# Patient Record
Sex: Female | Born: 1935 | Race: White | Hispanic: No | Smoking: Never smoker
Health system: Southern US, Community
[De-identification: ages and names within clinical notes are randomized; demographics above are authoritative.]

## PROBLEM LIST (undated history)

## (undated) DIAGNOSIS — N183 Chronic kidney disease, stage 3 unspecified: Secondary | ICD-10-CM

## (undated) DIAGNOSIS — E785 Hyperlipidemia, unspecified: Secondary | ICD-10-CM

## (undated) DIAGNOSIS — S72002A Fracture of unspecified part of neck of left femur, initial encounter for closed fracture: Secondary | ICD-10-CM

## (undated) DIAGNOSIS — I639 Cerebral infarction, unspecified: Secondary | ICD-10-CM

## (undated) DIAGNOSIS — H269 Unspecified cataract: Secondary | ICD-10-CM

## (undated) DIAGNOSIS — I4891 Unspecified atrial fibrillation: Secondary | ICD-10-CM

## (undated) DIAGNOSIS — N179 Acute kidney failure, unspecified: Secondary | ICD-10-CM

## (undated) DIAGNOSIS — I10 Essential (primary) hypertension: Secondary | ICD-10-CM

---

## 1999-03-02 ENCOUNTER — Other Ambulatory Visit: Admission: RE | Admit: 1999-03-02 | Discharge: 1999-03-02 | Payer: Self-pay | Admitting: *Deleted

## 2000-09-03 ENCOUNTER — Other Ambulatory Visit: Admission: RE | Admit: 2000-09-03 | Discharge: 2000-09-03 | Payer: Self-pay | Admitting: *Deleted

## 2001-01-10 ENCOUNTER — Encounter: Admission: RE | Admit: 2001-01-10 | Discharge: 2001-01-10 | Payer: Self-pay | Admitting: *Deleted

## 2001-01-10 ENCOUNTER — Encounter: Payer: Self-pay | Admitting: *Deleted

## 2001-01-15 ENCOUNTER — Encounter: Payer: Self-pay | Admitting: *Deleted

## 2001-01-15 ENCOUNTER — Encounter: Admission: RE | Admit: 2001-01-15 | Discharge: 2001-01-15 | Payer: Self-pay | Admitting: *Deleted

## 2002-02-28 ENCOUNTER — Encounter: Payer: Self-pay | Admitting: *Deleted

## 2002-02-28 ENCOUNTER — Encounter: Admission: RE | Admit: 2002-02-28 | Discharge: 2002-02-28 | Payer: Self-pay | Admitting: *Deleted

## 2002-03-19 ENCOUNTER — Encounter: Payer: Self-pay | Admitting: *Deleted

## 2002-03-19 ENCOUNTER — Encounter: Admission: RE | Admit: 2002-03-19 | Discharge: 2002-03-19 | Payer: Self-pay | Admitting: *Deleted

## 2005-05-31 ENCOUNTER — Ambulatory Visit (HOSPITAL_COMMUNITY): Admission: RE | Admit: 2005-05-31 | Discharge: 2005-05-31 | Payer: Self-pay | Admitting: Family Medicine

## 2005-06-13 ENCOUNTER — Other Ambulatory Visit: Admission: RE | Admit: 2005-06-13 | Discharge: 2005-06-13 | Payer: Self-pay | Admitting: Obstetrics and Gynecology

## 2005-06-15 ENCOUNTER — Encounter: Admission: RE | Admit: 2005-06-15 | Discharge: 2005-06-15 | Payer: Self-pay | Admitting: Family Medicine

## 2006-11-15 ENCOUNTER — Ambulatory Visit (HOSPITAL_COMMUNITY): Admission: RE | Admit: 2006-11-15 | Discharge: 2006-11-15 | Payer: Self-pay | Admitting: Family Medicine

## 2008-08-13 ENCOUNTER — Ambulatory Visit (HOSPITAL_COMMUNITY): Admission: RE | Admit: 2008-08-13 | Discharge: 2008-08-14 | Payer: Self-pay | Admitting: Ophthalmology

## 2008-08-27 ENCOUNTER — Ambulatory Visit (HOSPITAL_COMMUNITY): Admission: RE | Admit: 2008-08-27 | Discharge: 2008-08-27 | Payer: Self-pay | Admitting: Ophthalmology

## 2011-02-07 NOTE — Op Note (Signed)
NAME:  Elizabeth Pierce, Elizabeth Pierce                 ACCOUNT NO.:  [id number]   MEDICAL RECORD NO.:  [id number]          PATIENT TYPE:  OIB   LOCATION:  5158                         FACILITY:  [facility]   PHYSICIAN:  Beulah Gandy. Ashley Royalty, M.D. 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   PROCEDURES:  1. Pars plana vitrectomy with 25-gauge system.  2. Phacofragmentation.  3. Retinal photocoagulation.  4. Gas fluid exchange.  5. Membrane peel.  6. Silicone oral injection in the right eye.   SURGEON:  Beulah Gandy. Ashley Royalty, MD   ASSISTANT:  Rosalie Doctor, MA   ANESTHESIA:  General.   DETAILS:  After proper endotracheal anesthesia, the usual prep and drape  was performed, 25 gauge trocar placed at 8 o'clock with no access to the  vitreous cavity.  Dark red blood came forth, 25 gauge trocar placed at 4  o'clock with no access to the vitreous cavity.  Dark red blood came  forth, 25 gauge trocar at 10 o'clock and 2 o'clock.  Good access to the  vitreous cavity occurred and the vitreous area was illuminated.  Blood  was seen in the vitreous cavity as well as lens material, both nuclear  and cortical material.  An additional trocar was placed at 12 o'clock  with infusion.  Provisc was placed on the corneal surface.  The pars  plana vitrectomy was begun just behind the pseudophacos.  The lens  material was removed carefully under low suction and rapid cutting.  The  vitreous infusion was raised to 40 mmHg because large inferior choroidal  detachments were found to be almost kissing.  The choroidals were  drained with 25-gauge trocars at 4 o'clock and 8 o'clock and there was  some resolution of the choroidal opening up the vitreous cavity.  As the  vitrectomy proceeded posteriorly, yellow nuclear material and a  large  nucleus was seen.  The phacofragmentation device was inserted through a  new 20 gauge sclerotomy at 2:30 o'clock.  All of lens material was  removed.  Additional vitreous was removed with the 25-gauge cutter.  There was vitreous adherent to the posterior pole and disk.  This was  peeled with the lighted pick and removed with the vitreous cutter.  Endolaser was performed with 993 burns placed around the retinal  periphery.  The power was 1500 milliwatts, 1000 microns each and 0.1  seconds each.  After the entire vitreous cavity was cleaned, a total gas  fluid exchange was carried out with the 25-gauge cutter.  The choroidal  detachments partially resolved and additional blood came forth from the  peripheral trocars.  Silicone oil was then injected to re-inflate the  globe and take the place of the intravitreal gas.  A total fill was  performed but not an excess fill.  The trocars were removed individually  and dark red blood was allowed to egress as the trocars were  slowly  pulled.  Supplemental silicone oil was added as needed.  Once all  trocars were removed, the closing pressure was 10 with a Barraquer  tonometer.  Two interrupted 9-0 nylon sutures were used to close the  sclerotomy site at 2 o'clock.  The conjunctiva was closed with a wet-  field cautery.  Polymyxin and gentamicin were irrigated into Tenon's  space.  Atropine solution was applied.  Marcaine was injected around the  globe for postop pain.  Decadron 10 mg was injected into the lower  subconjunctival space.  Polysporin ophthalmic ointment and patch and  shield were placed.  The patient was awakened and taken to recovery in  satisfactory condition.   COMPLICATIONS:  None.   DURATION:  Two hours.      Beulah Gandy. Ashley Royalty, M.D.  Electronically Signed     JDM/MEDQ  D:  08/13/2008  T:  08/14/2008  Job:  [id number]

## 2011-02-07 NOTE — Op Note (Signed)
NAME:  Elizabeth Pierce, Elizabeth Pierce                 ACCOUNT NO.:  [id number]   MEDICAL RECORD NO.:  [id number]          PATIENT TYPE:  AMB   LOCATION:  SDS                          FACILITY:  [facility]   PHYSICIAN:  John D. Ashley Royalty, M.D. DATE OF BIRTH:  1919/09/03   DATE OF PROCEDURE:  08/27/2008  DATE OF DISCHARGE:  08/27/2008                               OPERATIVE REPORT   ADMISSION DIAGNOSIS:  Hyphema and glaucoma in the right eye.   PROCEDURE:  Anterior chamber washout of hyphema.   SURGEON:  Beulah Gandy. Ashley Royalty, M.D.   ANESTHESIA:  Local MAC.   DETAILS:  Usual prep and drape.  A 25-gauge trocar was placed through  the cornea in a tangential fashion at 7 o'clock and 2 o'clock, infusion  at 7 o'clock.  Continuous slow infusion was used and the vitreous cutter  was used to engage the hyphema and remove it from the anterior chamber.  Continuous rinsing allowed silicone oil droplets to come through the  trocar and out of the eye.  After 15 minutes of continuous rinsing, the  anterior chamber was totally cleaned and the gas was placed in the  anterior chamber.  The trocars were removed and 10-0 nylon was used to  close the wounds at 2 o'clock and 7 o'clock.  Additional gas was  injected to obtain a closing pressure of 10 with a Barraquer tonometer.  Polymyxin and gentamicin were irrigated in the Tenon space.  Atropine  solution was applied.  Decadron 10 mg was injected into the lower  subconjunctival space.  Marcaine was injected around the globe.  Polysporin Ophthalmic ointment, a patch and shield were placed.  The  patient was awakened and taken to recovery in satisfactory condition.      Beulah Gandy. Ashley Royalty, M.D.  Electronically Signed     JDM/MEDQ  D:  08/27/2008  T:  08/28/2008  Job:  [id number]

## 2011-06-13 ENCOUNTER — Encounter (INDEPENDENT_AMBULATORY_CARE_PROVIDER_SITE_OTHER): Payer: Medicare Other | Admitting: Ophthalmology

## 2011-06-13 DIAGNOSIS — H3530 Unspecified macular degeneration: Secondary | ICD-10-CM

## 2011-06-13 DIAGNOSIS — H26499 Other secondary cataract, unspecified eye: Secondary | ICD-10-CM

## 2011-06-13 DIAGNOSIS — H43819 Vitreous degeneration, unspecified eye: Secondary | ICD-10-CM

## 2011-06-20 ENCOUNTER — Encounter (INDEPENDENT_AMBULATORY_CARE_PROVIDER_SITE_OTHER): Payer: Medicare Other | Admitting: Ophthalmology

## 2011-06-26 ENCOUNTER — Encounter (INDEPENDENT_AMBULATORY_CARE_PROVIDER_SITE_OTHER): Payer: Medicare Other | Admitting: Ophthalmology

## 2011-06-26 DIAGNOSIS — H2700 Aphakia, unspecified eye: Secondary | ICD-10-CM

## 2011-06-26 DIAGNOSIS — H3530 Unspecified macular degeneration: Secondary | ICD-10-CM

## 2011-06-28 LAB — BASIC METABOLIC PANEL
BUN: 18
CO2: 26
Calcium: 9.6
Creatinine, Ser: 1.09
Glucose, Bld: 122 — ABNORMAL HIGH
Potassium: 4.3

## 2011-06-28 LAB — GLUCOSE, CAPILLARY
Glucose-Capillary: 134 — ABNORMAL HIGH
Glucose-Capillary: 158 — ABNORMAL HIGH

## 2011-06-28 LAB — CBC
HCT: 38
MCV: 86.9
WBC: 8.9

## 2011-06-30 LAB — GLUCOSE, CAPILLARY
Glucose-Capillary: 118 mg/dL — ABNORMAL HIGH (ref 70–99)
Glucose-Capillary: 94 mg/dL (ref 70–99)

## 2011-06-30 LAB — BASIC METABOLIC PANEL
BUN: 30 mg/dL — ABNORMAL HIGH (ref 6–23)
GFR calc non Af Amer: 29 mL/min — ABNORMAL LOW (ref >60)
Glucose, Bld: 112 mg/dL — ABNORMAL HIGH (ref 70–99)

## 2011-06-30 LAB — CBC
MCHC: 32.6 g/dL (ref 30.0–36.0)
MCV: 87.5 fL (ref 78.0–100.0)
WBC: 7.4 10*3/uL (ref 4.0–10.5)

## 2012-01-03 ENCOUNTER — Encounter (INDEPENDENT_AMBULATORY_CARE_PROVIDER_SITE_OTHER): Payer: Medicare Other | Admitting: Ophthalmology

## 2012-01-29 ENCOUNTER — Encounter (INDEPENDENT_AMBULATORY_CARE_PROVIDER_SITE_OTHER): Payer: Medicare Other | Admitting: Ophthalmology

## 2012-01-29 DIAGNOSIS — H43819 Vitreous degeneration, unspecified eye: Secondary | ICD-10-CM | POA: Diagnosis not present

## 2012-01-29 DIAGNOSIS — H3530 Unspecified macular degeneration: Secondary | ICD-10-CM | POA: Diagnosis not present

## 2012-02-15 DIAGNOSIS — E119 Type 2 diabetes mellitus without complications: Secondary | ICD-10-CM | POA: Diagnosis not present

## 2012-02-15 DIAGNOSIS — R5383 Other fatigue: Secondary | ICD-10-CM | POA: Diagnosis not present

## 2012-02-15 DIAGNOSIS — R5381 Other malaise: Secondary | ICD-10-CM | POA: Diagnosis not present

## 2012-02-15 DIAGNOSIS — I10 Essential (primary) hypertension: Secondary | ICD-10-CM | POA: Diagnosis not present

## 2012-02-15 DIAGNOSIS — R7989 Other specified abnormal findings of blood chemistry: Secondary | ICD-10-CM | POA: Diagnosis not present

## 2012-02-16 DIAGNOSIS — N390 Urinary tract infection, site not specified: Secondary | ICD-10-CM | POA: Diagnosis not present

## 2012-04-11 DIAGNOSIS — H409 Unspecified glaucoma: Secondary | ICD-10-CM | POA: Diagnosis not present

## 2012-05-06 DIAGNOSIS — L821 Other seborrheic keratosis: Secondary | ICD-10-CM | POA: Diagnosis not present

## 2012-05-20 DIAGNOSIS — I10 Essential (primary) hypertension: Secondary | ICD-10-CM | POA: Diagnosis not present

## 2012-05-20 DIAGNOSIS — E119 Type 2 diabetes mellitus without complications: Secondary | ICD-10-CM | POA: Diagnosis not present

## 2012-05-20 DIAGNOSIS — D509 Iron deficiency anemia, unspecified: Secondary | ICD-10-CM | POA: Diagnosis not present

## 2012-05-24 DIAGNOSIS — D539 Nutritional anemia, unspecified: Secondary | ICD-10-CM | POA: Diagnosis not present

## 2012-05-24 DIAGNOSIS — E119 Type 2 diabetes mellitus without complications: Secondary | ICD-10-CM | POA: Diagnosis not present

## 2012-05-24 DIAGNOSIS — I10 Essential (primary) hypertension: Secondary | ICD-10-CM | POA: Diagnosis not present

## 2012-06-17 DIAGNOSIS — R944 Abnormal results of kidney function studies: Secondary | ICD-10-CM | POA: Diagnosis not present

## 2012-07-23 DIAGNOSIS — Z23 Encounter for immunization: Secondary | ICD-10-CM | POA: Diagnosis not present

## 2012-07-31 ENCOUNTER — Ambulatory Visit (INDEPENDENT_AMBULATORY_CARE_PROVIDER_SITE_OTHER): Payer: Medicare Other | Admitting: Ophthalmology

## 2012-08-05 DIAGNOSIS — H4011X0 Primary open-angle glaucoma, stage unspecified: Secondary | ICD-10-CM | POA: Diagnosis not present

## 2012-08-06 ENCOUNTER — Ambulatory Visit (INDEPENDENT_AMBULATORY_CARE_PROVIDER_SITE_OTHER): Payer: Medicare Other | Admitting: Ophthalmology

## 2012-08-06 DIAGNOSIS — H3530 Unspecified macular degeneration: Secondary | ICD-10-CM

## 2012-08-06 DIAGNOSIS — H43819 Vitreous degeneration, unspecified eye: Secondary | ICD-10-CM

## 2012-08-06 DIAGNOSIS — H35439 Paving stone degeneration of retina, unspecified eye: Secondary | ICD-10-CM

## 2012-08-06 DIAGNOSIS — H4010X0 Unspecified open-angle glaucoma, stage unspecified: Secondary | ICD-10-CM

## 2012-09-16 DIAGNOSIS — E782 Mixed hyperlipidemia: Secondary | ICD-10-CM | POA: Diagnosis not present

## 2012-09-16 DIAGNOSIS — E119 Type 2 diabetes mellitus without complications: Secondary | ICD-10-CM | POA: Diagnosis not present

## 2012-09-16 DIAGNOSIS — R5383 Other fatigue: Secondary | ICD-10-CM | POA: Diagnosis not present

## 2012-09-16 DIAGNOSIS — R5381 Other malaise: Secondary | ICD-10-CM | POA: Diagnosis not present

## 2012-09-16 DIAGNOSIS — E785 Hyperlipidemia, unspecified: Secondary | ICD-10-CM | POA: Diagnosis not present

## 2012-09-16 DIAGNOSIS — D509 Iron deficiency anemia, unspecified: Secondary | ICD-10-CM | POA: Diagnosis not present

## 2012-09-16 DIAGNOSIS — I10 Essential (primary) hypertension: Secondary | ICD-10-CM | POA: Diagnosis not present

## 2013-02-03 ENCOUNTER — Ambulatory Visit (INDEPENDENT_AMBULATORY_CARE_PROVIDER_SITE_OTHER): Payer: Medicare Other | Admitting: Ophthalmology

## 2013-03-05 ENCOUNTER — Ambulatory Visit (INDEPENDENT_AMBULATORY_CARE_PROVIDER_SITE_OTHER): Payer: Medicare Other | Admitting: Ophthalmology

## 2013-03-05 DIAGNOSIS — H3530 Unspecified macular degeneration: Secondary | ICD-10-CM

## 2013-03-05 DIAGNOSIS — H35039 Hypertensive retinopathy, unspecified eye: Secondary | ICD-10-CM

## 2013-03-05 DIAGNOSIS — H1810 Bullous keratopathy, unspecified eye: Secondary | ICD-10-CM | POA: Diagnosis not present

## 2013-03-05 DIAGNOSIS — I10 Essential (primary) hypertension: Secondary | ICD-10-CM | POA: Diagnosis not present

## 2013-03-05 DIAGNOSIS — H43819 Vitreous degeneration, unspecified eye: Secondary | ICD-10-CM

## 2013-06-19 DIAGNOSIS — R5381 Other malaise: Secondary | ICD-10-CM | POA: Diagnosis not present

## 2013-06-19 DIAGNOSIS — M6281 Muscle weakness (generalized): Secondary | ICD-10-CM | POA: Diagnosis not present

## 2013-06-19 DIAGNOSIS — R7301 Impaired fasting glucose: Secondary | ICD-10-CM | POA: Diagnosis not present

## 2013-06-19 DIAGNOSIS — E119 Type 2 diabetes mellitus without complications: Secondary | ICD-10-CM | POA: Diagnosis not present

## 2013-06-19 DIAGNOSIS — I10 Essential (primary) hypertension: Secondary | ICD-10-CM | POA: Diagnosis not present

## 2013-06-19 DIAGNOSIS — M899 Disorder of bone, unspecified: Secondary | ICD-10-CM | POA: Diagnosis not present

## 2013-08-07 DIAGNOSIS — Z23 Encounter for immunization: Secondary | ICD-10-CM | POA: Diagnosis not present

## 2013-09-02 DIAGNOSIS — H353190 Nonexudative age-related macular degeneration, unspecified eye, stage unspecified: Secondary | ICD-10-CM | POA: Diagnosis not present

## 2013-09-02 DIAGNOSIS — H4011X0 Primary open-angle glaucoma, stage unspecified: Secondary | ICD-10-CM | POA: Diagnosis not present

## 2013-09-02 DIAGNOSIS — H409 Unspecified glaucoma: Secondary | ICD-10-CM | POA: Diagnosis not present

## 2013-10-17 DIAGNOSIS — S058X9A Other injuries of unspecified eye and orbit, initial encounter: Secondary | ICD-10-CM | POA: Diagnosis not present

## 2013-10-17 DIAGNOSIS — H1810 Bullous keratopathy, unspecified eye: Secondary | ICD-10-CM | POA: Diagnosis not present

## 2013-11-10 DIAGNOSIS — H409 Unspecified glaucoma: Secondary | ICD-10-CM | POA: Diagnosis present

## 2013-11-10 DIAGNOSIS — H4011X0 Primary open-angle glaucoma, stage unspecified: Secondary | ICD-10-CM | POA: Diagnosis not present

## 2013-11-10 DIAGNOSIS — H16009 Unspecified corneal ulcer, unspecified eye: Secondary | ICD-10-CM | POA: Diagnosis not present

## 2013-11-10 DIAGNOSIS — H353190 Nonexudative age-related macular degeneration, unspecified eye, stage unspecified: Secondary | ICD-10-CM | POA: Diagnosis not present

## 2013-11-10 DIAGNOSIS — I498 Other specified cardiac arrhythmias: Secondary | ICD-10-CM | POA: Diagnosis not present

## 2013-11-10 DIAGNOSIS — Z9849 Cataract extraction status, unspecified eye: Secondary | ICD-10-CM | POA: Diagnosis not present

## 2013-11-10 DIAGNOSIS — B9789 Other viral agents as the cause of diseases classified elsewhere: Secondary | ICD-10-CM | POA: Diagnosis not present

## 2013-11-10 DIAGNOSIS — I10 Essential (primary) hypertension: Secondary | ICD-10-CM | POA: Diagnosis present

## 2013-11-17 DIAGNOSIS — H5710 Ocular pain, unspecified eye: Secondary | ICD-10-CM | POA: Diagnosis not present

## 2013-11-17 DIAGNOSIS — H16039 Corneal ulcer with hypopyon, unspecified eye: Secondary | ICD-10-CM | POA: Diagnosis not present

## 2013-11-17 DIAGNOSIS — H16009 Unspecified corneal ulcer, unspecified eye: Secondary | ICD-10-CM | POA: Diagnosis not present

## 2013-11-17 DIAGNOSIS — H5440 Blindness, one eye, unspecified eye: Secondary | ICD-10-CM | POA: Diagnosis not present

## 2013-11-19 DIAGNOSIS — H5440 Blindness, one eye, unspecified eye: Secondary | ICD-10-CM | POA: Diagnosis not present

## 2013-11-19 DIAGNOSIS — H409 Unspecified glaucoma: Secondary | ICD-10-CM | POA: Diagnosis not present

## 2013-11-19 DIAGNOSIS — I10 Essential (primary) hypertension: Secondary | ICD-10-CM | POA: Diagnosis not present

## 2013-11-19 DIAGNOSIS — H3530 Unspecified macular degeneration: Secondary | ICD-10-CM | POA: Diagnosis not present

## 2013-11-19 DIAGNOSIS — H5710 Ocular pain, unspecified eye: Secondary | ICD-10-CM | POA: Diagnosis not present

## 2013-11-19 DIAGNOSIS — H168 Other keratitis: Secondary | ICD-10-CM | POA: Diagnosis not present

## 2013-11-19 DIAGNOSIS — F17200 Nicotine dependence, unspecified, uncomplicated: Secondary | ICD-10-CM | POA: Diagnosis not present

## 2013-11-27 DIAGNOSIS — Z9889 Other specified postprocedural states: Secondary | ICD-10-CM | POA: Diagnosis not present

## 2013-11-27 DIAGNOSIS — Z48810 Encounter for surgical aftercare following surgery on the sense organs: Secondary | ICD-10-CM | POA: Diagnosis not present

## 2013-12-03 ENCOUNTER — Ambulatory Visit (INDEPENDENT_AMBULATORY_CARE_PROVIDER_SITE_OTHER): Payer: Medicare Other | Admitting: Ophthalmology

## 2014-01-08 DIAGNOSIS — H5440 Blindness, one eye, unspecified eye: Secondary | ICD-10-CM | POA: Diagnosis not present

## 2014-01-08 DIAGNOSIS — H5710 Ocular pain, unspecified eye: Secondary | ICD-10-CM | POA: Diagnosis not present

## 2014-03-10 DIAGNOSIS — H5710 Ocular pain, unspecified eye: Secondary | ICD-10-CM | POA: Diagnosis not present

## 2014-03-10 DIAGNOSIS — Q111 Other anophthalmos: Secondary | ICD-10-CM | POA: Diagnosis not present

## 2014-03-10 DIAGNOSIS — H5440 Blindness, one eye, unspecified eye: Secondary | ICD-10-CM | POA: Diagnosis not present

## 2014-03-10 DIAGNOSIS — Z9889 Other specified postprocedural states: Secondary | ICD-10-CM | POA: Diagnosis not present

## 2014-05-04 DIAGNOSIS — H353190 Nonexudative age-related macular degeneration, unspecified eye, stage unspecified: Secondary | ICD-10-CM | POA: Diagnosis not present

## 2014-05-04 DIAGNOSIS — H409 Unspecified glaucoma: Secondary | ICD-10-CM | POA: Diagnosis not present

## 2014-05-04 DIAGNOSIS — H4011X0 Primary open-angle glaucoma, stage unspecified: Secondary | ICD-10-CM | POA: Diagnosis not present

## 2014-05-04 DIAGNOSIS — Z9889 Other specified postprocedural states: Secondary | ICD-10-CM | POA: Diagnosis not present

## 2014-06-18 DIAGNOSIS — E119 Type 2 diabetes mellitus without complications: Secondary | ICD-10-CM | POA: Diagnosis not present

## 2014-06-18 DIAGNOSIS — Z23 Encounter for immunization: Secondary | ICD-10-CM | POA: Diagnosis not present

## 2014-06-18 DIAGNOSIS — C383 Malignant neoplasm of mediastinum, part unspecified: Secondary | ICD-10-CM | POA: Diagnosis not present

## 2014-06-18 DIAGNOSIS — I10 Essential (primary) hypertension: Secondary | ICD-10-CM | POA: Diagnosis not present

## 2014-08-27 DIAGNOSIS — Q111 Other anophthalmos: Secondary | ICD-10-CM | POA: Diagnosis not present

## 2014-08-27 DIAGNOSIS — H3531 Nonexudative age-related macular degeneration: Secondary | ICD-10-CM | POA: Diagnosis not present

## 2014-08-27 DIAGNOSIS — H4011X4 Primary open-angle glaucoma, indeterminate stage: Secondary | ICD-10-CM | POA: Diagnosis not present

## 2014-11-17 DIAGNOSIS — E119 Type 2 diabetes mellitus without complications: Secondary | ICD-10-CM | POA: Diagnosis not present

## 2014-11-17 DIAGNOSIS — E782 Mixed hyperlipidemia: Secondary | ICD-10-CM | POA: Diagnosis not present

## 2014-11-19 DIAGNOSIS — I10 Essential (primary) hypertension: Secondary | ICD-10-CM | POA: Diagnosis not present

## 2014-11-19 DIAGNOSIS — E782 Mixed hyperlipidemia: Secondary | ICD-10-CM | POA: Diagnosis not present

## 2014-11-19 DIAGNOSIS — E1122 Type 2 diabetes mellitus with diabetic chronic kidney disease: Secondary | ICD-10-CM | POA: Diagnosis not present

## 2014-11-19 DIAGNOSIS — N183 Chronic kidney disease, stage 3 (moderate): Secondary | ICD-10-CM | POA: Diagnosis not present

## 2014-11-30 DIAGNOSIS — H1089 Other conjunctivitis: Secondary | ICD-10-CM | POA: Diagnosis not present

## 2014-11-30 DIAGNOSIS — Q111 Other anophthalmos: Secondary | ICD-10-CM | POA: Diagnosis not present

## 2014-11-30 DIAGNOSIS — H3531 Nonexudative age-related macular degeneration: Secondary | ICD-10-CM | POA: Diagnosis not present

## 2014-11-30 DIAGNOSIS — H4011X4 Primary open-angle glaucoma, indeterminate stage: Secondary | ICD-10-CM | POA: Diagnosis not present

## 2014-11-30 DIAGNOSIS — A499 Bacterial infection, unspecified: Secondary | ICD-10-CM | POA: Diagnosis not present

## 2014-12-02 DIAGNOSIS — A499 Bacterial infection, unspecified: Secondary | ICD-10-CM | POA: Diagnosis not present

## 2014-12-02 DIAGNOSIS — H4011X4 Primary open-angle glaucoma, indeterminate stage: Secondary | ICD-10-CM | POA: Diagnosis not present

## 2014-12-02 DIAGNOSIS — H1089 Other conjunctivitis: Secondary | ICD-10-CM | POA: Diagnosis not present

## 2014-12-02 DIAGNOSIS — Q111 Other anophthalmos: Secondary | ICD-10-CM | POA: Diagnosis not present

## 2014-12-02 DIAGNOSIS — H3531 Nonexudative age-related macular degeneration: Secondary | ICD-10-CM | POA: Diagnosis not present

## 2014-12-07 DIAGNOSIS — H1089 Other conjunctivitis: Secondary | ICD-10-CM | POA: Diagnosis not present

## 2014-12-07 DIAGNOSIS — H4011X4 Primary open-angle glaucoma, indeterminate stage: Secondary | ICD-10-CM | POA: Diagnosis not present

## 2014-12-07 DIAGNOSIS — A499 Bacterial infection, unspecified: Secondary | ICD-10-CM | POA: Diagnosis not present

## 2014-12-07 DIAGNOSIS — Q111 Other anophthalmos: Secondary | ICD-10-CM | POA: Diagnosis not present

## 2015-02-01 DIAGNOSIS — H3531 Nonexudative age-related macular degeneration: Secondary | ICD-10-CM | POA: Diagnosis not present

## 2015-02-01 DIAGNOSIS — H4011X4 Primary open-angle glaucoma, indeterminate stage: Secondary | ICD-10-CM | POA: Diagnosis not present

## 2015-03-05 ENCOUNTER — Encounter (HOSPITAL_COMMUNITY): Payer: Self-pay | Admitting: Emergency Medicine

## 2015-03-05 ENCOUNTER — Inpatient Hospital Stay (HOSPITAL_COMMUNITY)
Admission: EM | Admit: 2015-03-05 | Discharge: 2015-03-08 | DRG: 481 | Disposition: A | Discharge status: Skilled Nursing Facility | Payer: Medicare Other | Attending: Internal Medicine | Admitting: Internal Medicine

## 2015-03-05 ENCOUNTER — Emergency Department (HOSPITAL_COMMUNITY): Payer: Medicare Other

## 2015-03-05 DIAGNOSIS — W19XXXA Unspecified fall, initial encounter: Secondary | ICD-10-CM

## 2015-03-05 DIAGNOSIS — Y92000 Kitchen of unspecified non-institutional (private) residence as  the place of occurrence of the external cause: Secondary | ICD-10-CM

## 2015-03-05 DIAGNOSIS — D62 Acute posthemorrhagic anemia: Secondary | ICD-10-CM | POA: Diagnosis not present

## 2015-03-05 DIAGNOSIS — S72012A Unspecified intracapsular fracture of left femur, initial encounter for closed fracture: Principal | ICD-10-CM | POA: Diagnosis present

## 2015-03-05 DIAGNOSIS — Z9181 History of falling: Secondary | ICD-10-CM | POA: Diagnosis not present

## 2015-03-05 DIAGNOSIS — N390 Urinary tract infection, site not specified: Secondary | ICD-10-CM | POA: Diagnosis present

## 2015-03-05 DIAGNOSIS — R945 Abnormal results of liver function studies: Secondary | ICD-10-CM

## 2015-03-05 DIAGNOSIS — R488 Other symbolic dysfunctions: Secondary | ICD-10-CM | POA: Diagnosis not present

## 2015-03-05 DIAGNOSIS — S72002D Fracture of unspecified part of neck of left femur, subsequent encounter for closed fracture with routine healing: Secondary | ICD-10-CM | POA: Diagnosis not present

## 2015-03-05 DIAGNOSIS — T148 Other injury of unspecified body region: Secondary | ICD-10-CM | POA: Diagnosis not present

## 2015-03-05 DIAGNOSIS — N183 Chronic kidney disease, stage 3 unspecified: Secondary | ICD-10-CM | POA: Diagnosis present

## 2015-03-05 DIAGNOSIS — I129 Hypertensive chronic kidney disease with stage 1 through stage 4 chronic kidney disease, or unspecified chronic kidney disease: Secondary | ICD-10-CM | POA: Diagnosis present

## 2015-03-05 DIAGNOSIS — Z4789 Encounter for other orthopedic aftercare: Secondary | ICD-10-CM | POA: Diagnosis not present

## 2015-03-05 DIAGNOSIS — R278 Other lack of coordination: Secondary | ICD-10-CM | POA: Diagnosis not present

## 2015-03-05 DIAGNOSIS — N179 Acute kidney failure, unspecified: Secondary | ICD-10-CM | POA: Diagnosis not present

## 2015-03-05 DIAGNOSIS — I4891 Unspecified atrial fibrillation: Secondary | ICD-10-CM | POA: Diagnosis present

## 2015-03-05 DIAGNOSIS — E785 Hyperlipidemia, unspecified: Secondary | ICD-10-CM | POA: Diagnosis present

## 2015-03-05 DIAGNOSIS — W010XXA Fall on same level from slipping, tripping and stumbling without subsequent striking against object, initial encounter: Secondary | ICD-10-CM | POA: Diagnosis present

## 2015-03-05 DIAGNOSIS — R262 Difficulty in walking, not elsewhere classified: Secondary | ICD-10-CM | POA: Diagnosis not present

## 2015-03-05 DIAGNOSIS — R7989 Other specified abnormal findings of blood chemistry: Secondary | ICD-10-CM | POA: Diagnosis not present

## 2015-03-05 DIAGNOSIS — S72002A Fracture of unspecified part of neck of left femur, initial encounter for closed fracture: Secondary | ICD-10-CM | POA: Diagnosis not present

## 2015-03-05 DIAGNOSIS — M6281 Muscle weakness (generalized): Secondary | ICD-10-CM | POA: Diagnosis not present

## 2015-03-05 DIAGNOSIS — S299XXA Unspecified injury of thorax, initial encounter: Secondary | ICD-10-CM | POA: Diagnosis not present

## 2015-03-05 DIAGNOSIS — M79605 Pain in left leg: Secondary | ICD-10-CM | POA: Diagnosis not present

## 2015-03-05 DIAGNOSIS — Z66 Do not resuscitate: Secondary | ICD-10-CM | POA: Diagnosis present

## 2015-03-05 DIAGNOSIS — M25552 Pain in left hip: Secondary | ICD-10-CM | POA: Diagnosis not present

## 2015-03-05 DIAGNOSIS — H409 Unspecified glaucoma: Secondary | ICD-10-CM | POA: Diagnosis present

## 2015-03-05 DIAGNOSIS — I10 Essential (primary) hypertension: Secondary | ICD-10-CM | POA: Diagnosis not present

## 2015-03-05 DIAGNOSIS — S7292XA Unspecified fracture of left femur, initial encounter for closed fracture: Secondary | ICD-10-CM | POA: Diagnosis not present

## 2015-03-05 HISTORY — DX: Essential (primary) hypertension: I10

## 2015-03-05 HISTORY — DX: Hyperlipidemia, unspecified: E78.5

## 2015-03-05 HISTORY — DX: Unspecified atrial fibrillation: I48.91

## 2015-03-05 HISTORY — DX: Fracture of unspecified part of neck of left femur, initial encounter for closed fracture: S72.002A

## 2015-03-05 HISTORY — DX: Chronic kidney disease, stage 3 (moderate): N18.3

## 2015-03-05 HISTORY — DX: Chronic kidney disease, stage 3 unspecified: N18.30

## 2015-03-05 HISTORY — DX: Acute kidney failure, unspecified: N17.9

## 2015-03-05 LAB — URINALYSIS W MICROSCOPIC (NOT AT ARMC)
Bilirubin Urine: NEGATIVE
Glucose, UA: 250 mg/dL — AB
Ketones, ur: NEGATIVE mg/dL
Nitrite: NEGATIVE
PH: 5.5 (ref 5.0–8.0)
SPECIFIC GRAVITY, URINE: 1.025 (ref 1.005–1.030)
UROBILINOGEN UA: 0.2 mg/dL (ref 0.0–1.0)

## 2015-03-05 LAB — CBC WITH DIFFERENTIAL/PLATELET
Basophils Absolute: 0 10*3/uL (ref 0.0–0.1)
Basophils Relative: 0 % (ref 0–1)
EOS ABS: 0.1 10*3/uL (ref 0.0–0.7)
EOS PCT: 0 % (ref 0–5)
HEMATOCRIT: 35.1 % — AB (ref 36.0–46.0)
HEMOGLOBIN: 11.5 g/dL — AB (ref 12.0–15.0)
Lymphocytes Relative: 8 % — ABNORMAL LOW (ref 12–46)
Lymphs Abs: 1 10*3/uL (ref 0.7–4.0)
MCH: 29.4 pg (ref 26.0–34.0)
MCHC: 32.8 g/dL (ref 30.0–36.0)
MCV: 89.8 fL (ref 78.0–100.0)
MONO ABS: 0.9 10*3/uL (ref 0.1–1.0)
MONOS PCT: 8 % (ref 3–12)
NEUTROS ABS: 10.4 10*3/uL — AB (ref 1.7–7.7)
Neutrophils Relative %: 84 % — ABNORMAL HIGH (ref 43–77)
PLATELETS: 229 10*3/uL (ref 150–400)
RBC: 3.91 MIL/uL (ref 3.87–5.11)
RDW: 13.8 % (ref 11.5–15.5)
WBC: 12.4 10*3/uL — AB (ref 4.0–10.5)

## 2015-03-05 LAB — BASIC METABOLIC PANEL
Anion gap: 9 (ref 5–15)
BUN: 29 mg/dL — ABNORMAL HIGH (ref 6–20)
CHLORIDE: 104 mmol/L (ref 101–111)
CO2: 26 mmol/L (ref 22–32)
Calcium: 9.2 mg/dL (ref 8.9–10.3)
Creatinine, Ser: 1.41 mg/dL — ABNORMAL HIGH (ref 0.44–1.00)
GFR calc Af Amer: 36 mL/min — ABNORMAL LOW (ref >60)
GFR, EST NON AFRICAN AMERICAN: 31 mL/min — AB (ref >60)
GLUCOSE: 101 mg/dL — AB (ref 65–99)
POTASSIUM: 4.2 mmol/L (ref 3.5–5.1)
SODIUM: 139 mmol/L (ref 135–145)

## 2015-03-05 LAB — APTT: APTT: 32 s (ref 24–37)

## 2015-03-05 LAB — GLUCOSE, CAPILLARY: Glucose-Capillary: 179 mg/dL — ABNORMAL HIGH (ref 65–99)

## 2015-03-05 LAB — PROTIME-INR
INR: 1.15 (ref 0.00–1.49)
Prothrombin Time: 14.9 seconds (ref 11.6–15.2)

## 2015-03-05 LAB — ABO/RH: ABO/RH(D): B POS

## 2015-03-05 LAB — PREPARE RBC (CROSSMATCH)

## 2015-03-05 LAB — TSH
TSH: 1.068 u[IU]/mL (ref 0.350–4.500)
TSH: 0.589 u[IU]/mL (ref 0.350–4.500)

## 2015-03-05 MED ORDER — CEFAZOLIN SODIUM-DEXTROSE 2-3 GM-% IV SOLR
2.0000 g | INTRAVENOUS | Status: AC
  Administered 2015-03-06: 2 g via INTRAVENOUS
  Filled 2015-03-05 (×2): qty 50

## 2015-03-05 MED ORDER — HYDROCODONE-ACETAMINOPHEN 5-325 MG PO TABS: 1.0000 | ORAL_TABLET | ORAL | Status: DC | PRN

## 2015-03-05 MED ORDER — SODIUM CHLORIDE 0.9 % IV SOLN
INTRAVENOUS | Status: DC
  Administered 2015-03-05: 16:00:00 via INTRAVENOUS

## 2015-03-05 MED ORDER — ALPRAZOLAM 0.25 MG PO TABS: 0.2500 mg | ORAL_TABLET | Freq: Every evening | ORAL | Status: DC | PRN

## 2015-03-05 MED ORDER — TRAZODONE HCL 50 MG PO TABS: 25.0000 mg | ORAL_TABLET | Freq: Every evening | ORAL | Status: DC | PRN

## 2015-03-05 MED ORDER — CHLORHEXIDINE GLUCONATE 4 % EX LIQD
60.0000 mL | Freq: Once | CUTANEOUS | Status: AC
  Administered 2015-03-06: 4 via TOPICAL
  Filled 2015-03-05: qty 15

## 2015-03-05 MED ORDER — DOCUSATE SODIUM 100 MG PO CAPS
100.0000 mg | ORAL_CAPSULE | Freq: Two times a day (BID) | ORAL | Status: DC
  Administered 2015-03-05 – 2015-03-08 (×5): 100 mg via ORAL
  Filled 2015-03-05 (×5): qty 1

## 2015-03-05 MED ORDER — ONDANSETRON HCL 4 MG PO TABS: 4.0000 mg | ORAL_TABLET | Freq: Four times a day (QID) | ORAL | Status: DC | PRN

## 2015-03-05 MED ORDER — SODIUM CHLORIDE 0.9 % IJ SOLN
3.0000 mL | Freq: Two times a day (BID) | INTRAMUSCULAR | Status: DC
  Administered 2015-03-07: 3 mL via INTRAVENOUS

## 2015-03-05 MED ORDER — ACETAMINOPHEN 325 MG PO TABS
650.0000 mg | ORAL_TABLET | Freq: Four times a day (QID) | ORAL | Status: DC | PRN
  Administered 2015-03-05: 650 mg via ORAL
  Filled 2015-03-05: qty 2

## 2015-03-05 MED ORDER — SIMETHICONE 80 MG PO CHEW: 80.0000 mg | CHEWABLE_TABLET | Freq: Four times a day (QID) | ORAL | Status: DC | PRN

## 2015-03-05 MED ORDER — MAGNESIUM HYDROXIDE 400 MG/5ML PO SUSP: 30.0000 mL | Freq: Every day | ORAL | Status: DC | PRN

## 2015-03-05 MED ORDER — CHLORHEXIDINE GLUCONATE 4 % EX LIQD: 60.0000 mL | Freq: Once | CUTANEOUS | Status: DC

## 2015-03-05 MED ORDER — ONDANSETRON HCL 4 MG/2ML IJ SOLN: 4.0000 mg | Freq: Four times a day (QID) | INTRAMUSCULAR | Status: DC | PRN

## 2015-03-05 MED ORDER — BISACODYL 10 MG RE SUPP: 10.0000 mg | Freq: Every day | RECTAL | Status: DC | PRN

## 2015-03-05 MED ORDER — HEPARIN SODIUM (PORCINE) 5000 UNIT/ML IJ SOLN
5000.0000 [IU] | Freq: Three times a day (TID) | INTRAMUSCULAR | Status: AC
  Administered 2015-03-05 (×2): 5000 [IU] via SUBCUTANEOUS
  Filled 2015-03-05 (×2): qty 1

## 2015-03-05 MED ORDER — BRIMONIDINE TARTRATE 0.2 % OP SOLN
1.0000 [drp] | Freq: Two times a day (BID) | OPHTHALMIC | Status: DC
  Administered 2015-03-05: 1 [drp] via OPHTHALMIC
  Filled 2015-03-05: qty 5

## 2015-03-05 MED ORDER — MORPHINE SULFATE 2 MG/ML IJ SOLN: 1.0000 mg | INTRAMUSCULAR | Status: DC | PRN

## 2015-03-05 MED ORDER — BRIMONIDINE TARTRATE-TIMOLOL 0.2-0.5 % OP SOLN: 1.0000 [drp] | Freq: Two times a day (BID) | OPHTHALMIC | Status: DC

## 2015-03-05 MED ORDER — MORPHINE SULFATE 2 MG/ML IJ SOLN
2.0000 mg | Freq: Once | INTRAMUSCULAR | Status: AC
  Administered 2015-03-05: 2 mg via INTRAVENOUS
  Filled 2015-03-05: qty 1

## 2015-03-05 MED ORDER — METOPROLOL TARTRATE 25 MG PO TABS
12.5000 mg | ORAL_TABLET | Freq: Two times a day (BID) | ORAL | Status: DC
  Administered 2015-03-05 – 2015-03-07 (×3): 12.5 mg via ORAL
  Filled 2015-03-05 (×3): qty 1

## 2015-03-05 MED ORDER — TIMOLOL MALEATE 0.5 % OP SOLN: 1.0000 [drp] | Freq: Two times a day (BID) | OPHTHALMIC | Status: DC

## 2015-03-05 MED ORDER — LATANOPROST 0.005 % OP SOLN
1.0000 [drp] | Freq: Every day | OPHTHALMIC | Status: DC
Start: 2015-03-05 — End: 2015-03-08
  Administered 2015-03-05 – 2015-03-07 (×3): 1 [drp] via OPHTHALMIC
  Filled 2015-03-05: qty 2.5

## 2015-03-05 MED ORDER — MORPHINE SULFATE 2 MG/ML IJ SOLN
0.5000 mg | INTRAMUSCULAR | Status: DC | PRN
  Administered 2015-03-05 – 2015-03-06 (×2): 0.5 mg via INTRAVENOUS
  Filled 2015-03-05 (×2): qty 1

## 2015-03-05 MED ORDER — ACETAMINOPHEN 650 MG RE SUPP: 650.0000 mg | Freq: Four times a day (QID) | RECTAL | Status: DC | PRN

## 2015-03-05 MED ORDER — FLEET ENEMA 7-19 GM/118ML RE ENEM: 1.0000 | ENEMA | Freq: Once | RECTAL | Status: AC | PRN

## 2015-03-05 NOTE — H&P (Signed)
[facility] History and Physical  Elizabeth Pierce LFY:[id number] DOB: 08-29-1919 DOA: 03/05/2015  Referring physician: Lacinda Axon PCP: zach hall Chief Complaint: fall  HPI: Elizabeth Pierce is a delightful  81 y.o. female with a past medical history that includes hypertension, chronic kidney disease stage III, cataract status post surgery, eye infection status post prosthetic eye, since to the emergency department from home after falling in the kitchen on a puddle of water. Initial evaluation reveals a left femoral neck fracture, atrial fibrillation, and acute on chronic renal failure. Patient reports spilling a little bit of water on the kitchen floor this morning and cleaning it up believing she had cleaned it all. When she went back into the kitchen after eating her breakfast she slipped on the water and fell. She denies any loss of consciousness or hitting overfed. She was unable to get up so she "scooted on my behind" over to the telephone. Her family arrived and they were unable to get her up. EMS was called and they assisted her to her feet she was unable to bear weight on that left leg. She denies any chest pain palpitation dizziness headache syncope or near-syncope. She denies any fever chill recent travel or sick contacts. She denies abdominal pain nausea vomiting diarrhea but doesn't dorsi intermittent constipation for which she takes prune juice. She denies dysuria hematuria frequency or urgency. Workup in the emergency department includes complete blood count significant for WBCs 12.4 hemoglobin 85.2, basic metabolic panel significant for BUN 29 creatinine 1.41. Chest x-ray with no active disease, x-ray of the pelvis reveals subcapital fracture left femoral neck minimally displaced, x-ray femur left reveals impacted subcapital left femoral neck fracture. EKG reveals atrial fibrillation. In the emergency department she is provided with 2 mg of morphine intravenously. She is hemodynamically stable  afebrile and not hypoxic.   Review of Systems:  10 point review of systems complete and all systems are negative except as indicated by history of present illness  Past Medical History  Diagnosis Date  . Hypertension   . Fracture of femoral neck, left   . Acute renal failure   . CKD (chronic kidney disease), stage III     per PCP notes  . A-fib    History reviewed. No pertinent past surgical history. Social History:  reports that she has never smoked. She does not have any smokeless tobacco history on file. She reports that she does not drink alcohol or use illicit drugs.  No Known Allergies  History reviewed. No pertinent family history. she has a sister with hyper thyroid. She is unable to recall the medical history of any other family members  Prior to Admission medications   Medication Sig Start Date End Date Taking? Authorizing Provider  amLODipine (NORVASC) 10 MG tablet Take 10 mg by mouth daily. 02/15/15  Yes Historical Provider, MD  COMBIGAN 0.2-0.5 % ophthalmic solution Place 1 drop into the left eye every 12 (twelve) hours.  02/19/15  Yes Historical Provider, MD  docusate sodium (COLACE) 250 MG capsule Take 250 mg by mouth daily.   Yes Historical Provider, MD  irbesartan (AVAPRO) 300 MG tablet Take 300 mg by mouth daily. 02/13/15  Yes Historical Provider, MD  Multiple Vitamins-Minerals (MULTIVITAMIN WITH MINERALS) tablet Take 1 tablet by mouth daily.   Yes Historical Provider, MD  simethicone (MYLICON) 80 MG chewable tablet Chew 80 mg by mouth every 6 (six) hours as needed for flatulence.   Yes Historical Provider, MD  TRAVATAN Z 0.004 % SOLN  ophthalmic solution Place 1 drop into the left eye every evening.  02/19/15  Yes Historical Provider, MD  [facility] 3.75 G PACK Take 1 packet by mouth daily.  03/02/15  Yes Historical Provider, MD   Physical Exam: Filed Vitals:   03/05/15 1044 03/05/15 1330 03/05/15 1505 03/05/15 1527  BP: 141/51 129/63  122/51  Pulse: 77  103 106  Temp:  98.1 F (36.7 C)     TempSrc: Oral     Resp: 18  24 26   SpO2: 99%  97% 97%    Wt Readings from Last 3 Encounters:  No data found for Wt    General:  Appears calm and comfortable slightly pale Eyes: Right eye prosthesis ENT: grossly normal hearing, lips & tongue, because membranes of her mouth are pink and moist Neck: no LAD, masses or thyromegaly Cardiovascular: Irregularly irregular hear no murmur no gallop no lower extremity edema Telemetry: A. fib at a rate of 103  Respiratory: Normal effort breath sounds clear I hear no wheeze no rhonchi Abdomen: soft, ntnd positive bowel sounds throughout no rebounding Skin: no rash or induration seen on limited exam Musculoskeletal: grossly normal tone BUE/BLE, joints without swelling/erythema left hip tender to touch decreased range of motion due to pain Psychiatric: grossly normal mood and affect, speech fluent and appropriate Neurologic: grossly non-focal. speech clear facial symmetry           Labs on Admission:  Basic Metabolic Panel:  Recent Labs Lab 03/05/15 1330  NA 139  K 4.2  CL 104  CO2 26  GLUCOSE 101*  BUN 29*  CREATININE 1.41*  CALCIUM 9.2   Liver Function Tests: No results for input(s): AST, ALT, ALKPHOS, BILITOT, PROT, ALBUMIN in the last 168 hours. No results for input(s): LIPASE, AMYLASE in the last 168 hours. No results for input(s): AMMONIA in the last 168 hours. CBC:  Recent Labs Lab 03/05/15 1330  WBC 12.4*  NEUTROABS 10.4*  HGB 11.5*  HCT 35.1*  MCV 89.8  PLT 229   Cardiac Enzymes: No results for input(s): CKTOTAL, CKMB, CKMBINDEX, TROPONINI in the last 168 hours.  BNP (last 3 results) No results for input(s): BNP in the last 8760 hours.  ProBNP (last 3 results) No results for input(s): PROBNP in the last 8760 hours.  CBG: No results for input(s): GLUCAP in the last 168 hours.  Radiological Exams on Admission: Dg Pelvis 1-2 Views  03/05/2015   CLINICAL DATA:  LEFT leg pain, fell this  morning at home  EXAM: PELVIS - 1-2 VIEW  COMPARISON:  None  FINDINGS: Diffuse osseous demineralization.  Hip and SI joint spaces symmetric and preserved.  Minimally displaced subcapital fracture LEFT femoral neck.  No dislocation.  Pelvis appears intact.  Few pelvic phleboliths.  IMPRESSION: Subcapital fracture LEFT femoral neck, minimally displaced.   Electronically Signed   By: Lavonia Dana M.D.   On: 03/05/2015 12:25   Dg Chest Portable 1 View  03/05/2015   CLINICAL DATA:  Fall in the kitchen. Initial encounter.  EXAM: PORTABLE CHEST - 1 VIEW  COMPARISON:  08/13/2008  FINDINGS: Borderline cardiomegaly, with size accentuated by rotation. Negative aortic and hilar contours for technique. Chronic mild coarsening of basilar lung markings. There is no edema, consolidation, effusion, or pneumothorax. No appreciable rib fracture.  IMPRESSION: No active disease.   Electronically Signed   By: Monte Fantasia M.D.   On: 03/05/2015 14:06   Dg Femur Min 2 Views Left  03/05/2015   CLINICAL DATA:  Fall.  Initial encounter.  EXAM: LEFT FEMUR 2 VIEWS  COMPARISON:  None.  FINDINGS: Laterally impacted subcapital left femoral neck fracture. No femoral head dislocation. No significant degenerative changes to the left acetabulum. No additional fracture.  IMPRESSION: Impacted subcapital left femoral neck fracture.   Electronically Signed   By: Monte Fantasia M.D.   On: 03/05/2015 12:28    EKG: Independently reviewed to her fibrillation  Assessment/Plan Principal Problem:   Fracture of left femur: Secondary to mechanical fall. She's been evaluated by orthopedic surgery who recommended surgery in the morning. We will admit to telemetry. Provide supportive therapy in the form of pain medicine. Will make her nothing by mouth past midnight. Goldmans preoperative risk score 38 (age and afib). Active Problems:   A-fib: Likely new onset. She denies any palpitations shortness of breath chest pain. Will repeat her EKG in the  morning will obtain a TSH and an echocardiogram. Start low-dose beta blocker with parameters. Will hold amlodipine and irbesartan for now.  Acute renal failure in setting of CKD (chronic kidney disease), stage III. Current creatinine 1.41. I spoke to her primary care provider office who indicate her creatinine 6 months ago was 1.3. Additionly medical history at PCPs office does have chronic kidney disease stage III documented. Will gently hydrate with IV fluids. Will hold ARB for now. Monitor intake and output.      Hypertension: Fair control on admission. Home medications include amlodipine, Avapro. Will hold Avapro and amlodipine for now. Will provide low-dose beta blocker with parameters given #2. Will monitor closely and resume as indicated.  Dr Aline Brochure  Code Status: full (must indicate code status--if unknown or must be presumed, indicate so) DVT Prophylaxis: Family Communication: daughter and grandaughter at bedside Disposition Plan: likey need snf  Time spent: 58 minutes  [facility] Pager [phone]

## 2015-03-05 NOTE — ED Notes (Signed)
Nurse in a room , to call back for report shortly.

## 2015-03-05 NOTE — ED Notes (Signed)
Repaged Dr. Aline Brochure at [phone] to [phone]

## 2015-03-05 NOTE — ED Notes (Signed)
hospitalist at bedside

## 2015-03-05 NOTE — ED Provider Notes (Signed)
CSN: [id number]     Arrival date & time 03/05/15  1040 History  This chart was scribed for Nat Christen, MD by Eustaquio Maize, ED Scribe. This patient was seen in room APA19/APA19 and the patient's care was started at 11:05 AM.    Chief Complaint  Patient presents with  . Fall   The history is provided by the patient and a relative. No language interpreter was used.     HPI Comments: RAYLA PEMBER is a 81 y.o. female brought in by ambulance, who presents to the Emergency Department complaining of left thigh pain s/p ground level fall that occurred earlier today. Daughter reports that pt was walking to the chair after getting a glass of water and fell. Pt called daughter afterwards but does not remember falling. Daughter mentions that pt was able to stand up after the fall but was favoring her right side. Pt lives at home by herself. Denies neck pain, shoulder pain, chest pain, abdominal pain, or any other symptoms.   Past Medical History  Diagnosis Date  . Hypertension    History reviewed. No pertinent past surgical history. History reviewed. No pertinent family history. History  Substance Use Topics  . Smoking status: Never Smoker   . Smokeless tobacco: Not on file  . Alcohol Use: No   OB History    No data available     Review of Systems  A complete 10 system review of systems was obtained and all systems are negative except as noted in the HPI and PMH.    Allergies  Review of patient's allergies indicates no known allergies.  Home Medications   Prior to Admission medications   Medication Sig Start Date End Date Taking? Authorizing Provider  amLODipine (NORVASC) 10 MG tablet Take 10 mg by mouth daily. 02/15/15  Yes Historical Provider, MD  COMBIGAN 0.2-0.5 % ophthalmic solution Place 1 drop into the left eye every 12 (twelve) hours.  02/19/15  Yes Historical Provider, MD  docusate sodium (COLACE) 250 MG capsule Take 250 mg by mouth daily.   Yes Historical Provider, MD   irbesartan (AVAPRO) 300 MG tablet Take 300 mg by mouth daily. 02/13/15  Yes Historical Provider, MD  Multiple Vitamins-Minerals (MULTIVITAMIN WITH MINERALS) tablet Take 1 tablet by mouth daily.   Yes Historical Provider, MD  simethicone (MYLICON) 80 MG chewable tablet Chew 80 mg by mouth every 6 (six) hours as needed for flatulence.   Yes Historical Provider, MD  TRAVATAN Z 0.004 % SOLN ophthalmic solution Place 1 drop into the left eye every evening.  02/19/15  Yes Historical Provider, MD  [facility] 3.75 G PACK Take 1 packet by mouth daily.  03/02/15  Yes Historical Provider, MD   Triage Vitals: BP 141/51 mmHg  Pulse 77  Temp(Src) 98.1 F (36.7 C) (Oral)  Resp 18  SpO2 99%   Physical Exam  Constitutional: She is oriented to person, place, and time. She appears well-developed and well-nourished.  HENT:  Head: Normocephalic and atraumatic.  Eyes: Conjunctivae and EOM are normal. Pupils are equal, round, and reactive to light.  Neck: Normal range of motion. Neck supple.  Cardiovascular: Normal rate and regular rhythm.   Pulmonary/Chest: Effort normal and breath sounds normal.  Abdominal: Soft. Bowel sounds are normal.  Musculoskeletal: Normal range of motion.  Neurological: She is alert and oriented to person, place, and time.  Skin: Skin is warm and dry.  Psychiatric: She has a normal mood and affect. Her behavior is normal.  Nursing note  and vitals reviewed.   ED Course  Procedures (including critical care time)  DIAGNOSTIC STUDIES: Oxygen Saturation is 99% on RA, normal by my interpretation.    COORDINATION OF CARE: 11:08 AM-Discussed treatment plan which includes DG Pelvis and DG L Femur with pt at bedside and pt agreed to plan.   Labs Review Labs Reviewed  CBC WITH DIFFERENTIAL/PLATELET - Abnormal; Notable for the following:    WBC 12.4 (*)    Hemoglobin 11.5 (*)    HCT 35.1 (*)    Neutrophils Relative % 84 (*)    Neutro Abs 10.4 (*)    Lymphocytes Relative 8 (*)    All  other components within normal limits  BASIC METABOLIC PANEL - Abnormal; Notable for the following:    Glucose, Bld 101 (*)    BUN 29 (*)    Creatinine, Ser 1.41 (*)    GFR calc non Af Amer 31 (*)    GFR calc Af Amer 36 (*)    All other components within normal limits    Imaging Review Dg Pelvis 1-2 Views  03/05/2015   CLINICAL DATA:  LEFT leg pain, fell this morning at home  EXAM: PELVIS - 1-2 VIEW  COMPARISON:  None  FINDINGS: Diffuse osseous demineralization.  Hip and SI joint spaces symmetric and preserved.  Minimally displaced subcapital fracture LEFT femoral neck.  No dislocation.  Pelvis appears intact.  Few pelvic phleboliths.  IMPRESSION: Subcapital fracture LEFT femoral neck, minimally displaced.   Electronically Signed   By: Lavonia Dana M.D.   On: 03/05/2015 12:25   Dg Chest Portable 1 View  03/05/2015   CLINICAL DATA:  Fall in the kitchen. Initial encounter.  EXAM: PORTABLE CHEST - 1 VIEW  COMPARISON:  08/13/2008  FINDINGS: Borderline cardiomegaly, with size accentuated by rotation. Negative aortic and hilar contours for technique. Chronic mild coarsening of basilar lung markings. There is no edema, consolidation, effusion, or pneumothorax. No appreciable rib fracture.  IMPRESSION: No active disease.   Electronically Signed   By: Monte Fantasia M.D.   On: 03/05/2015 14:06   Dg Femur Min 2 Views Left  03/05/2015   CLINICAL DATA:  Fall.  Initial encounter.  EXAM: LEFT FEMUR 2 VIEWS  COMPARISON:  None.  FINDINGS: Laterally impacted subcapital left femoral neck fracture. No femoral head dislocation. No significant degenerative changes to the left acetabulum. No additional fracture.  IMPRESSION: Impacted subcapital left femoral neck fracture.   Electronically Signed   By: Monte Fantasia M.D.   On: 03/05/2015 12:28     EKG Interpretation   Date/Time:  Friday March 05 2015 13:57:58 EDT Ventricular Rate:  103 PR Interval:    QRS Duration: 69 QT Interval:  349 QTC Calculation: 457 R  Axis:   75 Text Interpretation:  Atrial fibrillation Borderline repolarization  abnormality Confirmed by Gerrard Crystal  MD, Sapna Padron ([id number]) on 03/05/2015 2:41:08 PM      MDM   Final diagnoses:  Fall  Fracture of left femur, closed, initial encounter    Patient presents with a accidental mechanical fall.  Plain films show a impacted subcapital left femoral neck fracture. EKG reveals new onset atrial fibrillation. Discussed with Dr. Arther Abbott orthopedics. Admit to general medicine Dr. Murray Hodgkins   I personally performed the services described in this documentation, which was scribed in my presence. The recorded information has been reviewed and is accurate.      Nat Christen, MD 03/05/15 [phone]

## 2015-03-05 NOTE — ED Notes (Signed)
MD at bedside. 

## 2015-03-05 NOTE — ED Notes (Signed)
Orthopedic surgeon at bedside. 

## 2015-03-05 NOTE — Anesthesia Preprocedure Evaluation (Signed)
Anesthesia Evaluation  Patient identified by MRN, date of birth, ID band Patient awake    Reviewed: Allergy & Precautions, NPO status , Patient's Chart, lab work & pertinent test results  Airway Mallampati: II  TM Distance: >3 FB Neck ROM: Full    Dental  (+) Edentulous Upper, Edentulous Lower   Pulmonary          Cardiovascular Exercise Tolerance: Good hypertension, Pt. on medications  12 lead AFIB; no prior history of EKG abnormality   Neuro/Psych    GI/Hepatic   Endo/Other    Renal/GU CRFRenal diseaseStage 3 CRF; possible ARF     Musculoskeletal   Abdominal   Peds  Hematology   Anesthesia Other Findings Prosthetic eye right  Reproductive/Obstetrics                             Anesthesia Physical Anesthesia Plan  ASA: II and emergent  Anesthesia Plan: Spinal   Post-op Pain Management:    Induction:   Airway Management Planned: Simple Face Mask  Additional Equipment:   Intra-op Plan:   Post-operative Plan:   Informed Consent: I have reviewed the patients History and Physical, chart, labs and discussed the procedure including the risks, benefits and alternatives for the proposed anesthesia with the patient or authorized representative who has indicated his/her understanding and acceptance.     Plan Discussed with: Anesthesiologist  Anesthesia Plan Comments:         Anesthesia Quick Evaluation

## 2015-03-05 NOTE — Progress Notes (Signed)
PT Cancellation Note  Patient Details Name: Elizabeth Pierce MRN: [id number] DOB: Oct 29, 1918   Cancelled Treatment:     We have received a request for a PT consult for 03-06-15 which will actually be the date of her hip surgery.  Therefore, we will await post-op orders from the Orthopedist anticipated to begin on 03-07-15.   Sable Feil 03/05/2015, 5:24 PM

## 2015-03-05 NOTE — ED Notes (Signed)
Per EMS, pt fell this am in kitchen. When EMS arrived pt on couch. Pt does not remember falling. CBG en route 201. Pt reports left leg pain. nad noted.

## 2015-03-06 ENCOUNTER — Encounter (HOSPITAL_COMMUNITY)
Admission: EM | Disposition: A | Discharge status: Skilled Nursing Facility | Payer: Self-pay | Source: Home / Self Care | Attending: Family Medicine

## 2015-03-06 ENCOUNTER — Inpatient Hospital Stay (HOSPITAL_COMMUNITY): Payer: Medicare Other | Admitting: Anesthesiology

## 2015-03-06 ENCOUNTER — Inpatient Hospital Stay (HOSPITAL_COMMUNITY): Payer: Medicare Other

## 2015-03-06 DIAGNOSIS — S72002A Fracture of unspecified part of neck of left femur, initial encounter for closed fracture: Secondary | ICD-10-CM

## 2015-03-06 DIAGNOSIS — N183 Chronic kidney disease, stage 3 (moderate): Secondary | ICD-10-CM

## 2015-03-06 DIAGNOSIS — S7292XA Unspecified fracture of left femur, initial encounter for closed fracture: Secondary | ICD-10-CM

## 2015-03-06 DIAGNOSIS — R945 Abnormal results of liver function studies: Secondary | ICD-10-CM

## 2015-03-06 DIAGNOSIS — R7989 Other specified abnormal findings of blood chemistry: Secondary | ICD-10-CM | POA: Diagnosis present

## 2015-03-06 DIAGNOSIS — I4891 Unspecified atrial fibrillation: Secondary | ICD-10-CM

## 2015-03-06 HISTORY — PX: HIP PINNING,CANNULATED: SHX1758

## 2015-03-06 LAB — COMPREHENSIVE METABOLIC PANEL
ALT: 300 U/L — ABNORMAL HIGH (ref 14–54)
AST: 265 U/L — ABNORMAL HIGH (ref 15–41)
Albumin: 3.3 g/dL — ABNORMAL LOW (ref 3.5–5.0)
Alkaline Phosphatase: 101 U/L (ref 38–126)
Anion gap: 9 (ref 5–15)
BUN: 23 mg/dL — AB (ref 6–20)
CHLORIDE: 105 mmol/L (ref 101–111)
CO2: 25 mmol/L (ref 22–32)
Calcium: 8.7 mg/dL — ABNORMAL LOW (ref 8.9–10.3)
Creatinine, Ser: 1.09 mg/dL — ABNORMAL HIGH (ref 0.44–1.00)
GFR calc Af Amer: 49 mL/min — ABNORMAL LOW (ref >60)
GFR calc non Af Amer: 42 mL/min — ABNORMAL LOW (ref >60)
Glucose, Bld: 114 mg/dL — ABNORMAL HIGH (ref 65–99)
POTASSIUM: 4 mmol/L (ref 3.5–5.1)
SODIUM: 139 mmol/L (ref 135–145)
TOTAL PROTEIN: 6.5 g/dL (ref 6.5–8.1)
Total Bilirubin: 2 mg/dL — ABNORMAL HIGH (ref 0.3–1.2)

## 2015-03-06 LAB — SURGICAL PCR SCREEN
MRSA, PCR: POSITIVE — AB
STAPHYLOCOCCUS AUREUS: POSITIVE — AB

## 2015-03-06 LAB — CBC
HEMATOCRIT: 35.4 % — AB (ref 36.0–46.0)
Hemoglobin: 11.8 g/dL — ABNORMAL LOW (ref 12.0–15.0)
MCH: 29.5 pg (ref 26.0–34.0)
MCHC: 33.3 g/dL (ref 30.0–36.0)
MCV: 88.5 fL (ref 78.0–100.0)
Platelets: 204 10*3/uL (ref 150–400)
RBC: 4 MIL/uL (ref 3.87–5.11)
RDW: 13.8 % (ref 11.5–15.5)
WBC: 6.4 10*3/uL (ref 4.0–10.5)

## 2015-03-06 SURGERY — FIXATION, FEMUR, NECK, PERCUTANEOUS, USING SCREW
Anesthesia: Spinal | Site: Hip | Laterality: Left

## 2015-03-06 MED ORDER — SODIUM CHLORIDE 0.9 % IV SOLN
INTRAVENOUS | Status: DC
  Administered 2015-03-06: 13:00:00 via INTRAVENOUS

## 2015-03-06 MED ORDER — ASPIRIN EC 325 MG PO TBEC
325.0000 mg | DELAYED_RELEASE_TABLET | Freq: Every day | ORAL | Status: DC
  Administered 2015-03-07 – 2015-03-08 (×2): 325 mg via ORAL
  Filled 2015-03-06 (×2): qty 1

## 2015-03-06 MED ORDER — EPHEDRINE SULFATE 50 MG/ML IJ SOLN
INTRAMUSCULAR | Status: AC
  Filled 2015-03-06: qty 1

## 2015-03-06 MED ORDER — MUPIROCIN 2 % EX OINT
1.0000 "application " | TOPICAL_OINTMENT | Freq: Two times a day (BID) | CUTANEOUS | Status: DC
  Administered 2015-03-07 – 2015-03-08 (×4): 1 via NASAL
  Filled 2015-03-06: qty 22

## 2015-03-06 MED ORDER — FENTANYL CITRATE (PF) 100 MCG/2ML IJ SOLN
INTRAMUSCULAR | Status: AC
  Filled 2015-03-06: qty 2

## 2015-03-06 MED ORDER — CEFAZOLIN SODIUM-DEXTROSE 2-3 GM-% IV SOLR
2.0000 g | Freq: Four times a day (QID) | INTRAVENOUS | Status: AC
  Administered 2015-03-06 – 2015-03-07 (×3): 2 g via INTRAVENOUS
  Filled 2015-03-06 (×3): qty 50

## 2015-03-06 MED ORDER — PROPOFOL INFUSION 10 MG/ML OPTIME
INTRAVENOUS | Status: DC | PRN
  Administered 2015-03-06: 25 ug/kg/min via INTRAVENOUS

## 2015-03-06 MED ORDER — POLYETHYLENE GLYCOL 3350 17 G PO PACK
17.0000 g | PACK | Freq: Every day | ORAL | Status: DC
  Administered 2015-03-06 – 2015-03-07 (×2): 17 g via ORAL
  Filled 2015-03-06 (×2): qty 1

## 2015-03-06 MED ORDER — PHENOL 1.4 % MT LIQD: 1.0000 | OROMUCOSAL | Status: DC | PRN

## 2015-03-06 MED ORDER — SODIUM CHLORIDE 0.9 % IR SOLN
Status: DC | PRN
  Administered 2015-03-06: 1000 mL

## 2015-03-06 MED ORDER — PHENYLEPHRINE HCL 10 MG/ML IJ SOLN
INTRAMUSCULAR | Status: DC | PRN
  Administered 2015-03-06 (×2): 25 ug via INTRAVENOUS
  Administered 2015-03-06: 50 ug via INTRAVENOUS
  Administered 2015-03-06: 40 ug via INTRAVENOUS

## 2015-03-06 MED ORDER — FLEET ENEMA 7-19 GM/118ML RE ENEM: 1.0000 | ENEMA | Freq: Once | RECTAL | Status: AC | PRN

## 2015-03-06 MED ORDER — ONDANSETRON HCL 4 MG/2ML IJ SOLN: 4.0000 mg | Freq: Four times a day (QID) | INTRAMUSCULAR | Status: DC | PRN

## 2015-03-06 MED ORDER — PHENYLEPHRINE 40 MCG/ML (10ML) SYRINGE FOR IV PUSH (FOR BLOOD PRESSURE SUPPORT)
PREFILLED_SYRINGE | INTRAVENOUS | Status: AC
  Filled 2015-03-06: qty 10

## 2015-03-06 MED ORDER — FENTANYL CITRATE (PF) 100 MCG/2ML IJ SOLN
INTRAMUSCULAR | Status: DC | PRN
  Administered 2015-03-06: 12.5 ug via INTRAVENOUS
  Administered 2015-03-06: 12.5 ug via INTRATHECAL

## 2015-03-06 MED ORDER — ACETAMINOPHEN 10 MG/ML IV SOLN
1000.0000 mg | Freq: Four times a day (QID) | INTRAVENOUS | Status: AC
  Administered 2015-03-06 – 2015-03-07 (×3): 1000 mg via INTRAVENOUS
  Filled 2015-03-06 (×3): qty 100

## 2015-03-06 MED ORDER — SODIUM CHLORIDE 0.9 % IV SOLN
INTRAVENOUS | Status: DC | PRN
  Administered 2015-03-06: 09:00:00 via INTRAVENOUS

## 2015-03-06 MED ORDER — LIDOCAINE HCL (CARDIAC) 10 MG/ML IV SOLN
INTRAVENOUS | Status: DC | PRN
  Administered 2015-03-06: 20 mg via INTRAVENOUS

## 2015-03-06 MED ORDER — ALUM & MAG HYDROXIDE-SIMETH 200-200-20 MG/5ML PO SUSP: 30.0000 mL | ORAL | Status: DC | PRN

## 2015-03-06 MED ORDER — BUPIVACAINE-EPINEPHRINE (PF) 0.5% -1:200000 IJ SOLN
INTRAMUSCULAR | Status: DC | PRN
  Administered 2015-03-06: 60 mL via PERINEURAL

## 2015-03-06 MED ORDER — ACETAMINOPHEN 10 MG/ML IV SOLN
1000.0000 mg | Freq: Four times a day (QID) | INTRAVENOUS | Status: DC
  Filled 2015-03-06 (×4): qty 100

## 2015-03-06 MED ORDER — EPHEDRINE SULFATE 50 MG/ML IJ SOLN
INTRAMUSCULAR | Status: DC | PRN
  Administered 2015-03-06 (×2): 5 mg via INTRAVENOUS
  Administered 2015-03-06: 7.5 mg via INTRAVENOUS
  Administered 2015-03-06 (×3): 2.5 mg via INTRAVENOUS

## 2015-03-06 MED ORDER — HYDROCODONE-ACETAMINOPHEN 5-325 MG PO TABS: 1.0000 | ORAL_TABLET | Freq: Four times a day (QID) | ORAL | Status: DC | PRN

## 2015-03-06 MED ORDER — BISACODYL 10 MG RE SUPP: 10.0000 mg | Freq: Every day | RECTAL | Status: DC | PRN

## 2015-03-06 MED ORDER — PHENYLEPHRINE HCL 10 MG/ML IJ SOLN
INTRAMUSCULAR | Status: AC
  Filled 2015-03-06: qty 1

## 2015-03-06 MED ORDER — ONDANSETRON HCL 4 MG PO TABS: 4.0000 mg | ORAL_TABLET | Freq: Four times a day (QID) | ORAL | Status: DC | PRN

## 2015-03-06 MED ORDER — METHOCARBAMOL 500 MG PO TABS: 500.0000 mg | ORAL_TABLET | Freq: Four times a day (QID) | ORAL | Status: DC | PRN

## 2015-03-06 MED ORDER — BUPIVACAINE IN DEXTROSE 0.75-8.25 % IT SOLN
INTRATHECAL | Status: AC
  Filled 2015-03-06: qty 2

## 2015-03-06 MED ORDER — BUPIVACAINE-EPINEPHRINE (PF) 0.5% -1:200000 IJ SOLN
INTRAMUSCULAR | Status: AC
  Filled 2015-03-06: qty 60

## 2015-03-06 MED ORDER — MIDAZOLAM HCL 5 MG/5ML IJ SOLN
INTRAMUSCULAR | Status: DC | PRN
  Administered 2015-03-06 (×2): .5 mg via INTRAVENOUS

## 2015-03-06 MED ORDER — METOCLOPRAMIDE HCL 5 MG/ML IJ SOLN: 5.0000 mg | Freq: Three times a day (TID) | INTRAMUSCULAR | Status: DC | PRN

## 2015-03-06 MED ORDER — DEXTROSE 5 % IV SOLN
500.0000 mg | Freq: Four times a day (QID) | INTRAVENOUS | Status: DC | PRN
  Filled 2015-03-06: qty 5

## 2015-03-06 MED ORDER — PROPOFOL 10 MG/ML IV BOLUS
INTRAVENOUS | Status: AC
  Filled 2015-03-06: qty 20

## 2015-03-06 MED ORDER — MIDAZOLAM HCL 2 MG/2ML IJ SOLN
INTRAMUSCULAR | Status: AC
  Filled 2015-03-06: qty 2

## 2015-03-06 MED ORDER — SODIUM CHLORIDE 0.9 % IJ SOLN
INTRAMUSCULAR | Status: AC
  Filled 2015-03-06: qty 10

## 2015-03-06 MED ORDER — LIDOCAINE HCL (PF) 1 % IJ SOLN
INTRAMUSCULAR | Status: AC
  Filled 2015-03-06: qty 5

## 2015-03-06 MED ORDER — METOCLOPRAMIDE HCL 10 MG PO TABS: 5.0000 mg | ORAL_TABLET | Freq: Three times a day (TID) | ORAL | Status: DC | PRN

## 2015-03-06 MED ORDER — BUPIVACAINE IN DEXTROSE 0.75-8.25 % IT SOLN
INTRATHECAL | Status: DC | PRN
  Administered 2015-03-06: 11.25 mg via INTRATHECAL

## 2015-03-06 MED ORDER — ACETAMINOPHEN 10 MG/ML IV SOLN
1000.0000 mg | Freq: Once | INTRAVENOUS | Status: DC
  Administered 2015-03-06: 1000 mg via INTRAVENOUS
  Filled 2015-03-06: qty 100

## 2015-03-06 MED ORDER — CHLORHEXIDINE GLUCONATE CLOTH 2 % EX PADS
6.0000 | MEDICATED_PAD | Freq: Every day | CUTANEOUS | Status: DC
  Administered 2015-03-06 – 2015-03-08 (×3): 6 via TOPICAL

## 2015-03-06 MED ORDER — TRAMADOL HCL 50 MG PO TABS
50.0000 mg | ORAL_TABLET | Freq: Four times a day (QID) | ORAL | Status: DC
  Administered 2015-03-06 (×2): 50 mg via ORAL
  Filled 2015-03-06 (×2): qty 1

## 2015-03-06 MED ORDER — MENTHOL 3 MG MT LOZG: 1.0000 | LOZENGE | OROMUCOSAL | Status: DC | PRN

## 2015-03-06 MED ORDER — EPINEPHRINE HCL 0.1 MG/ML IJ SOSY
PREFILLED_SYRINGE | INTRAMUSCULAR | Status: DC | PRN
  Administered 2015-03-06: .1 ug via INTRAVENOUS

## 2015-03-06 SURGICAL SUPPLY — 55 items
BAG HAMPER (MISCELLANEOUS) ×3 IMPLANT
BIT DRILL ACE CANN 5.5 (BIT) ×2 IMPLANT
BIT DRILL ACE CANN 5.5MM (BIT) ×1
BLADE 10 SAFETY STRL DISP (BLADE) ×6 IMPLANT
BLADE HEX COATED 2.75 (ELECTRODE) ×3 IMPLANT
CHLORAPREP W/TINT 26ML (MISCELLANEOUS) ×3 IMPLANT
CLOTH BEACON ORANGE TIMEOUT ST (SAFETY) ×3 IMPLANT
COVER LIGHT HANDLE STERIS (MISCELLANEOUS) ×12 IMPLANT
COVER MAYO STAND XLG (DRAPE) IMPLANT
DECANTER SPIKE VIAL GLASS SM (MISCELLANEOUS) ×6 IMPLANT
DRAPE STERI IOBAN 125X83 (DRAPES) ×3 IMPLANT
DRESSING ALLEVYN LIFE SACRUM (GAUZE/BANDAGES/DRESSINGS) ×2 IMPLANT
DRESSING MEPILEX BORDER 6X8 (GAUZE/BANDAGES/DRESSINGS) IMPLANT
DRSG MEPILEX BORDER 4X12 (GAUZE/BANDAGES/DRESSINGS) ×3 IMPLANT
DRSG MEPILEX BORDER 6X8 (GAUZE/BANDAGES/DRESSINGS) ×3
DRSG TEGADERM 4X4.75 (GAUZE/BANDAGES/DRESSINGS) IMPLANT
GAUZE KERLIX 2X3 DERM STRL LF (GAUZE/BANDAGES/DRESSINGS) ×3 IMPLANT
GLOVE SKINSENSE NS SZ8.0 LF (GLOVE) ×2
GLOVE SKINSENSE STRL SZ8.0 LF (GLOVE) ×1 IMPLANT
GLOVE SS N UNI LF 8.5 STRL (GLOVE) ×3 IMPLANT
GOWN STRL REUS W/TWL LRG LVL3 (GOWN DISPOSABLE) ×12 IMPLANT
GOWN STRL REUS W/TWL XL LVL3 (GOWN DISPOSABLE) ×3 IMPLANT
INST SET MAJOR BONE (KITS) ×3 IMPLANT
KIT BLADEGUARD II DBL (SET/KITS/TRAYS/PACK) ×3 IMPLANT
KIT ROOM TURNOVER APOR (KITS) ×3 IMPLANT
MANIFOLD NEPTUNE II (INSTRUMENTS) ×3 IMPLANT
MARKER SKIN DUAL TIP RULER LAB (MISCELLANEOUS) ×3 IMPLANT
NDL HYPO 21X1.5 SAFETY (NEEDLE) ×1 IMPLANT
NDL SPNL 18GX3.5 QUINCKE PK (NEEDLE) ×1 IMPLANT
NEEDLE HYPO 21X1.5 SAFETY (NEEDLE) ×3 IMPLANT
NEEDLE SPNL 18GX3.5 QUINCKE PK (NEEDLE) ×3 IMPLANT
NS IRRIG 1000ML POUR BTL (IV SOLUTION) ×3 IMPLANT
PACK BASIC III (CUSTOM PROCEDURE TRAY) ×3
PACK SRG BSC III STRL LF ECLPS (CUSTOM PROCEDURE TRAY) ×1 IMPLANT
PAD ABD 5X9 TENDERSORB (GAUZE/BANDAGES/DRESSINGS) IMPLANT
PENCIL HANDSWITCHING (ELECTRODE) ×3 IMPLANT
PIN THREADED GUIDE ACE (PIN) ×12 IMPLANT
SCREW CANN 6.5 80MM (Screw) ×6 IMPLANT
SCREW CANN 6.5 90MM (Screw) ×3 IMPLANT
SCREW CANN LG 6.5 FLT 80X22 (Screw) IMPLANT
SCREW CANN LG 6.5 FLT 90X22 (Screw) IMPLANT
SET BASIN LINEN APH (SET/KITS/TRAYS/PACK) ×3 IMPLANT
SPONGE LAP 18X18 X RAY DECT (DISPOSABLE) ×3 IMPLANT
STAPLER VISISTAT 35W (STAPLE) ×3 IMPLANT
SUT BRALON NAB BRD #1 30IN (SUTURE) ×3 IMPLANT
SUT MNCRL 0 VIOLET CTX 36 (SUTURE) ×2 IMPLANT
SUT MON AB 2-0 CT1 36 (SUTURE) ×3 IMPLANT
SUT MONOCRYL 0 CTX 36 (SUTURE) ×4
SYR 30ML LL (SYRINGE) ×3 IMPLANT
SYR BULB IRRIGATION 50ML (SYRINGE) ×6 IMPLANT
TOWEL OR 17X26 4PK STRL BLUE (TOWEL DISPOSABLE) ×3 IMPLANT
TRAY FOLEY CATH SILVER 16FR (SET/KITS/TRAYS/PACK) ×2 IMPLANT
WASHER ACECAN 6.5 (Washer) ×4 IMPLANT
YANKAUER SUCT 12FT TUBE ARGYLE (SUCTIONS) ×3 IMPLANT
YANKAUER SUCT BULB TIP 10FT TU (MISCELLANEOUS) ×3 IMPLANT

## 2015-03-06 NOTE — Progress Notes (Signed)
Utilization review Completed Michiel Sivley RN BSN   

## 2015-03-06 NOTE — Anesthesia Procedure Notes (Addendum)
Date/Time: 03/06/2015 8:50 AM Performed by: Vista Deck Pre-anesthesia Checklist: Patient identified, Emergency Drugs available, Suction available, Timeout performed and Patient being monitored Patient Re-evaluated:Patient Re-evaluated prior to inductionOxygen Delivery Method: Non-rebreather mask    Spinal Patient location during procedure: OR Start time: 03/06/2015 9:00 AM End time: 03/06/2015 9:09 AM Staffing Resident/CRNA: Drucie Opitz S Preanesthetic Checklist Completed: patient identified, site marked, surgical consent, pre-op evaluation, timeout performed, IV checked, risks and benefits discussed and monitors and equipment checked Spinal Block Patient position: left lateral decubitus Prep: Betadine Patient monitoring: heart rate, cardiac monitor, continuous pulse ox and blood pressure Approach: left paramedian Location: L3-4 Injection technique: single-shot Needle Needle type: Spinocan  Needle gauge: 22 G Needle length: 9 cm Assessment Sensory level: T6 Additional Notes Betadine prep x3 1% lidocaine skinwheal  Clear CSF pre and post injection  ATTEMPTS: 2 TRAY ID: [id number] TRAY EXPIRATION DATE: 11-2015  Performed by: Vista Deck Pre-anesthesia Checklist: Patient identified, Emergency Drugs available, Suction available, Timeout performed and Patient being monitored Patient Re-evaluated:Patient Re-evaluated prior to inductionOxygen Delivery Method: Nasal Cannula

## 2015-03-06 NOTE — Op Note (Signed)
03/05/2015 - 03/06/2015  10:30 AM  PATIENT:  Elizabeth Pierce  81 y.o. female  PRE-OPERATIVE DIAGNOSIS:  left femoral neck fracture  POST-OPERATIVE DIAGNOSIS:  left femoral neck fracture  Findings: non displaced valgus impacted left femoral neck fracture   Implants: depuy TITANIUM cannulated screws x 3 with 2 washers   Details of procedure 81 year old female fell at home fractured her left hip present with a valgus impacted left hip fracture. Preop medical clearance was obtained and the patient was brought to surgery. In the preop area the surgical site was confirmed and marked. Patient was taken to the operating room for spinal anesthesia. She had Ancef for IV and a biotics. She was placed on the fracture table peroneal post right leg in well leg holder left leg in traction. C-arm was brought in. Radiographs confirmed adequate reduction.  The leg was prepped and draped from the pelvis iliac crest to the foot. Timeout was completed the surgical team was in agreement with the procedure operative site and radiographs were up and available for viewing  The incision was made at the inferior aspect of the trochanter taken down to fascia. Fascia was split in line with the skin incision. The vastus lateralis was taken from the trochanteric ridge and elevated from the bone subperiosteally. 3 wires/pins were placed in the hip and reviewed with the C-arm. Once adequate position was obtained on both AP and lateral planes, the pins were measured individually overdrilled cortex only and then placed and hand tightened using 2 washers. The third screw did not have a washer.  The final implant x-rays showed stabilization of the fracture adequate position of the hardware  The wounds were irrigated.  The vastus lateralis was closed and repaired back to the vastus ridge. The fascial layer was closed in interrupted fashion with 0 Monocryl. The subcutaneous layer was closed with 2-0 Monocryl. Skin was reapproximated  with staples.  Marcaine with epinephrine 30 mL injected under the first fascial layer and 30 mL injected in the skin area  Sterile dressing applied  Patient placed on regular bed taken back to the preop area  PROCEDURE:  Procedure(s): CANNULATED HIP PINNING (Left)  SURGEON:  Surgeon(s) and Role:    * Carole Civil, MD - Primary  PHYSICIAN ASSISTANT:   ASSISTANTS: none   ANESTHESIA:   spinal  EBL:  Total I/O In: 300 [I.V.:300] Out: 325 [Urine:300; Blood:25]  BLOOD ADMINISTERED:none  DRAINS: none   LOCAL MEDICATIONS USED:  MARCAINE     SPECIMEN:  No Specimen  DISPOSITION OF SPECIMEN:  N/A  COUNTS:  YES  TOURNIQUET:  * No tourniquets in log *  DICTATION: .Dragon Dictation  PLAN OF CARE: Admit to inpatient   PATIENT DISPOSITION:  PACU - hemodynamically stable.   Delay start of Pharmacological VTE agent (>24hrs) due to surgical blood loss or risk of bleeding: yes

## 2015-03-06 NOTE — Consult Note (Signed)
Reason for Consult:farctured left hip Referring Physician: Lacinda Axon, MD   Elizabeth Pierce is an 81 y.o. female.  HPI: 66 independent ambulator in the home, fell injured left hip, stood up but had trouble walking. C/o severe non radiating left hip pain   Past Medical History  Diagnosis Date  . Hypertension   . Fracture of femoral neck, left   . Acute renal failure   . CKD (chronic kidney disease), stage III     per PCP notes  . A-fib   . Hyperlipidemia     History reviewed. No pertinent past surgical history.  History reviewed. No pertinent family history.  Social History:  reports that she has never smoked. She does not have any smokeless tobacco history on file. She reports that she does not drink alcohol or use illicit drugs.  Allergies: No Known Allergies  Medications: I have reviewed the patient's current medications.  Results for orders placed or performed during the hospital encounter of 03/05/15 (from the past 48 hour(s))  CBC with Differential     Status: Abnormal   Collection Time: 03/05/15  1:30 PM  Result Value Ref Range   WBC 12.4 (H) 4.0 - 10.5 K/uL   RBC 3.91 3.87 - 5.11 MIL/uL   Hemoglobin 11.5 (L) 12.0 - 15.0 g/dL   HCT 35.1 (L) 36.0 - 46.0 %   MCV 89.8 78.0 - 100.0 fL   MCH 29.4 26.0 - 34.0 pg   MCHC 32.8 30.0 - 36.0 g/dL   RDW 13.8 11.5 - 15.5 %   Platelets 229 150 - 400 K/uL   Neutrophils Relative % 84 (H) 43 - 77 %   Neutro Abs 10.4 (H) 1.7 - 7.7 K/uL   Lymphocytes Relative 8 (L) 12 - 46 %   Lymphs Abs 1.0 0.7 - 4.0 K/uL   Monocytes Relative 8 3 - 12 %   Monocytes Absolute 0.9 0.1 - 1.0 K/uL   Eosinophils Relative 0 0 - 5 %   Eosinophils Absolute 0.1 0.0 - 0.7 K/uL   Basophils Relative 0 0 - 1 %   Basophils Absolute 0.0 0.0 - 0.1 K/uL  Basic metabolic panel     Status: Abnormal   Collection Time: 03/05/15  1:30 PM  Result Value Ref Range   Sodium 139 135 - 145 mmol/L   Potassium 4.2 3.5 - 5.1 mmol/L   Chloride 104 101 - 111 mmol/L   CO2 26 22 - 32  mmol/L   Glucose, Bld 101 (H) 65 - 99 mg/dL   BUN 29 (H) 6 - 20 mg/dL   Creatinine, Ser 1.41 (H) 0.44 - 1.00 mg/dL   Calcium 9.2 8.9 - 10.3 mg/dL   GFR calc non Af Amer 31 (L) >60 mL/min   GFR calc Af Amer 36 (L) >60 mL/min    Comment: (NOTE) The eGFR has been calculated using the CKD EPI equation. This calculation has not been validated in all clinical situations. eGFR's persistently <60 mL/min signify possible Chronic Kidney Disease.    Anion gap 9 5 - 15  TSH     Status: None   Collection Time: 03/05/15  1:30 PM  Result Value Ref Range   TSH 1.068 0.350 - 4.500 uIU/mL  Glucose, capillary     Status: Abnormal   Collection Time: 03/05/15  5:25 PM  Result Value Ref Range   Glucose-Capillary 179 (H) 65 - 99 mg/dL   Comment 1 Notify RN    Comment 2 Document in Chart   Prepare  RBC (crossmatch)     Status: None   Collection Time: 03/05/15  9:05 PM  Result Value Ref Range   Order Confirmation ORDER PROCESSED BY BLOOD BANK   APTT     Status: None   Collection Time: 03/05/15  9:05 PM  Result Value Ref Range   aPTT 32 24 - 37 seconds  Protime-INR     Status: None   Collection Time: 03/05/15  9:05 PM  Result Value Ref Range   Prothrombin Time 14.9 11.6 - 15.2 seconds   INR 1.15 0.00 - 1.49  TSH     Status: None   Collection Time: 03/05/15  9:05 PM  Result Value Ref Range   TSH 0.589 0.350 - 4.500 uIU/mL  Type and screen     Status: None (Preliminary result)   Collection Time: 03/05/15  9:05 PM  Result Value Ref Range   ABO/RH(D) B POS    Antibody Screen NEG    Sample Expiration 03/08/2015    Unit Number [id number]    Blood Component Type RED CELLS,LR    Unit division 00    Status of Unit ALLOCATED    Transfusion Status OK TO TRANSFUSE    Crossmatch Result Compatible    Unit Number [id number]    Blood Component Type RED CELLS,LR    Unit division 00    Status of Unit ALLOCATED    Transfusion Status OK TO TRANSFUSE    Crossmatch Result Compatible    Unit Number  [id number]    Blood Component Type RBC LR PHER2    Unit division 00    Status of Unit ALLOCATED    Transfusion Status OK TO TRANSFUSE    Crossmatch Result Compatible   ABO/Rh     Status: None   Collection Time: 03/05/15  9:05 PM  Result Value Ref Range   ABO/RH(D) B POS   Surgical pcr screen     Status: Abnormal   Collection Time: 03/05/15 10:47 PM  Result Value Ref Range   MRSA, PCR POSITIVE (A) NEGATIVE    Comment: RESULT CALLED TO, READ BACK BY AND VERIFIED WITH:  HANCOCK,C @ 0025 ON 03/06/15 BY WOODIE,J    Staphylococcus aureus POSITIVE (A) NEGATIVE    Comment:        The Xpert SA Assay (FDA approved for NASAL specimens in patients over 74 years of age), is one component of a comprehensive surveillance program.  Test performance has been validated by [facility] for patients greater than or equal to 49 year old. It is not intended to diagnose infection nor to guide or monitor treatment. RESULT CALLED TO, READ BACK BY AND VERIFIED WITH:  HANCOCK,C @ 0025 ON 03/06/15 BY WOODIE,J   Urinalysis with microscopic     Status: Abnormal   Collection Time: 03/05/15 10:48 PM  Result Value Ref Range   Color, Urine YELLOW YELLOW   APPearance HAZY (A) CLEAR   Specific Gravity, Urine 1.025 1.005 - 1.030   pH 5.5 5.0 - 8.0   Glucose, UA 250 (A) NEGATIVE mg/dL   Hgb urine dipstick TRACE (A) NEGATIVE   Bilirubin Urine NEGATIVE NEGATIVE   Ketones, ur NEGATIVE NEGATIVE mg/dL   Protein, ur TRACE (A) NEGATIVE mg/dL   Urobilinogen, UA 0.2 0.0 - 1.0 mg/dL   Nitrite NEGATIVE NEGATIVE   Leukocytes, UA TRACE (A) NEGATIVE   WBC, UA 11-20 <3 WBC/hpf   RBC / HPF 0-2 <3 RBC/hpf   Bacteria, UA MANY (A) RARE   Squamous Epithelial / LPF  FEW (A) RARE  Culture, blood (routine x 2)     Status: None (Preliminary result)   Collection Time: 03/05/15 10:50 PM  Result Value Ref Range   Specimen Description BLOOD LEFT ARM    Special Requests BOTTLES DRAWN AEROBIC AND ANAEROBIC 8CC    Culture NO  GROWTH 1 DAY    Report Status PENDING   Culture, blood (routine x 2)     Status: None (Preliminary result)   Collection Time: 03/05/15 10:55 PM  Result Value Ref Range   Specimen Description BLOOD LEFT HAND    Special Requests BOTTLES DRAWN AEROBIC AND ANAEROBIC 8CC    Culture NO GROWTH 1 DAY    Report Status PENDING   Comprehensive metabolic panel     Status: Abnormal   Collection Time: 03/06/15  5:55 AM  Result Value Ref Range   Sodium 139 135 - 145 mmol/L   Potassium 4.0 3.5 - 5.1 mmol/L   Chloride 105 101 - 111 mmol/L   CO2 25 22 - 32 mmol/L   Glucose, Bld 114 (H) 65 - 99 mg/dL   BUN 23 (H) 6 - 20 mg/dL   Creatinine, Ser 1.09 (H) 0.44 - 1.00 mg/dL   Calcium 8.7 (L) 8.9 - 10.3 mg/dL   Total Protein 6.5 6.5 - 8.1 g/dL   Albumin 3.3 (L) 3.5 - 5.0 g/dL   AST 265 (H) 15 - 41 U/L   ALT 300 (H) 14 - 54 U/L   Alkaline Phosphatase 101 38 - 126 U/L   Total Bilirubin 2.0 (H) 0.3 - 1.2 mg/dL   GFR calc non Af Amer 42 (L) >60 mL/min   GFR calc Af Amer 49 (L) >60 mL/min    Comment: (NOTE) The eGFR has been calculated using the CKD EPI equation. This calculation has not been validated in all clinical situations. eGFR's persistently <60 mL/min signify possible Chronic Kidney Disease.    Anion gap 9 5 - 15  CBC     Status: Abnormal   Collection Time: 03/06/15  5:55 AM  Result Value Ref Range   WBC 6.4 4.0 - 10.5 K/uL   RBC 4.00 3.87 - 5.11 MIL/uL   Hemoglobin 11.8 (L) 12.0 - 15.0 g/dL   HCT 35.4 (L) 36.0 - 46.0 %   MCV 88.5 78.0 - 100.0 fL   MCH 29.5 26.0 - 34.0 pg   MCHC 33.3 30.0 - 36.0 g/dL   RDW 13.8 11.5 - 15.5 %   Platelets 204 150 - 400 K/uL    Dg Pelvis 1-2 Views  03/05/2015   CLINICAL DATA:  LEFT leg pain, fell this morning at home  EXAM: PELVIS - 1-2 VIEW  COMPARISON:  None  FINDINGS: Diffuse osseous demineralization.  Hip and SI joint spaces symmetric and preserved.  Minimally displaced subcapital fracture LEFT femoral neck.  No dislocation.  Pelvis appears intact.   Few pelvic phleboliths.  IMPRESSION: Subcapital fracture LEFT femoral neck, minimally displaced.   Electronically Signed   By: Lavonia Dana M.D.   On: 03/05/2015 12:25   Dg Chest Portable 1 View  03/05/2015   CLINICAL DATA:  Fall in the kitchen. Initial encounter.  EXAM: PORTABLE CHEST - 1 VIEW  COMPARISON:  08/13/2008  FINDINGS: Borderline cardiomegaly, with size accentuated by rotation. Negative aortic and hilar contours for technique. Chronic mild coarsening of basilar lung markings. There is no edema, consolidation, effusion, or pneumothorax. No appreciable rib fracture.  IMPRESSION: No active disease.   Electronically Signed   By: Angelica Chessman  Watts M.D.   On: 03/05/2015 14:06   Dg Femur Min 2 Views Left  03/05/2015   CLINICAL DATA:  Fall.  Initial encounter.  EXAM: LEFT FEMUR 2 VIEWS  COMPARISON:  None.  FINDINGS: Laterally impacted subcapital left femoral neck fracture. No femoral head dislocation. No significant degenerative changes to the left acetabulum. No additional fracture.  IMPRESSION: Impacted subcapital left femoral neck fracture.   Electronically Signed   By: Monte Fantasia M.D.   On: 03/05/2015 12:28    Review of Systems  HENT: Positive for hearing loss.   Eyes:       Prosthetic right eye   Musculoskeletal: Positive for joint pain.  All other systems reviewed and are negative.  Blood pressure 127/47, pulse 60, temperature 99.8 F (37.7 C), temperature source Oral, resp. rate 18, height 5' 1"  (1.549 m), weight 114 lb 3.2 oz (51.8 kg), SpO2 97 %. Physical Exam  Constitutional: She is oriented to person, place, and time. She appears well-developed and well-nourished. No distress.  HENT:  Head: Normocephalic.  Eyes: Right eye exhibits no discharge. Left eye exhibits no discharge.  Neck: Neck supple. No JVD present. No tracheal deviation present. No thyromegaly present.  Cardiovascular: Normal rate.   Respiratory: Effort normal. No stridor. No respiratory distress.  GI: Soft. She  exhibits no distension. There is no tenderness.  Musculoskeletal:       Left hip: She exhibits decreased range of motion, tenderness and bony tenderness. She exhibits normal strength, no swelling, no crepitus, no deformity and no laceration.       Legs: Normal upper extremities and normal right lower extremity   Lymphadenopathy:    She has no cervical adenopathy.  Neurological: She is alert and oriented to person, place, and time. No cranial nerve deficit. She exhibits normal muscle tone.  Skin: Skin is warm and dry. No rash noted. She is not diaphoretic. No erythema. No pallor.  Psychiatric: She has a normal mood and affect. Her behavior is normal. Judgment and thought content normal.    Assessment/Plan: Imaging: lreviewed: pelvis , hip and femur. Left hip Non displaced femoral neck fracture left hip   Rec: otif with cannulated screws left hip.  Risks benefits complications explained     Arther Abbott 03/06/2015, 8:02 AM

## 2015-03-06 NOTE — Brief Op Note (Signed)
03/05/2015 - 03/06/2015  10:30 AM  PATIENT:  Leonarda Salon [place]deen  81 y.o. female  PRE-OPERATIVE DIAGNOSIS:  left femoral neck fracture  POST-OPERATIVE DIAGNOSIS:  left femoral neck fracture  Findings: non displaced valgus impacted left femoral neck fracture   Implants: depuy TITANIUM cannulated screws x 3 with 2 washers   PROCEDURE:  Procedure(s): CANNULATED HIP PINNING (Left)  SURGEON:  Surgeon(s) and Role:    * Carole Civil, MD - Primary  PHYSICIAN ASSISTANT:   ASSISTANTS: none   ANESTHESIA:   spinal  EBL:  Total I/O In: 300 [I.V.:300] Out: 325 [Urine:300; Blood:25]  BLOOD ADMINISTERED:none  DRAINS: none   LOCAL MEDICATIONS USED:  MARCAINE     SPECIMEN:  No Specimen  DISPOSITION OF SPECIMEN:  N/A  COUNTS:  YES  TOURNIQUET:  * No tourniquets in log *  DICTATION: .Dragon Dictation  PLAN OF CARE: Admit to inpatient   PATIENT DISPOSITION:  PACU - hemodynamically stable.   Delay start of Pharmacological VTE agent (>24hrs) due to surgical blood loss or risk of bleeding: yes

## 2015-03-06 NOTE — Op Note (Signed)
03/05/2015 - 03/06/2015  10:30 AM  PATIENT:  Elizabeth Pierce  81 y.o. female  PRE-OPERATIVE DIAGNOSIS:  left femoral neck fracture  POST-OPERATIVE DIAGNOSIS:  left femoral neck fracture  Findings: non displaced valgus impacted left femoral neck fracture   Implants: depuy TITANIUM cannulated screws x 3 with 2 washers   PROCEDURE:  Procedure(s): CANNULATED HIP PINNING (Left)  Details:   The patient was identified in the preop area as Elizabeth Pierce:  Surgeon(s) and Role:    * Carole Civil, MD - Primary  PHYSICIAN ASSISTANT:   ASSISTANTS: none   ANESTHESIA:   spinal  EBL:  Total I/O In: 300 [I.V.:300] Out: 325 [Urine:300; Blood:25]  BLOOD ADMINISTERED:none  DRAINS: none   LOCAL MEDICATIONS USED:  MARCAINE     SPECIMEN:  No Specimen  DISPOSITION OF SPECIMEN:  N/A  COUNTS:  YES  TOURNIQUET:  * No tourniquets in log *  DICTATION: .Dragon Dictation  PLAN OF CARE: Admit to inpatient   PATIENT DISPOSITION:  PACU - hemodynamically stable.   Delay start of Pharmacological VTE agent (>24hrs) due to surgical blood loss or risk of bleeding: yes

## 2015-03-06 NOTE — Progress Notes (Signed)
PROGRESS NOTE  JESSAMY TOROSYAN GBT:[id number] DOB: 1918/12/07 DOA: 03/05/2015 PCP: Delphina Cahill, MD  Summary:  81 year old woman who lives alone, slipped on some water and fell sustaining a hip fracture. EKG revealed atrial fibrillation.she underwent successful surgery 6/11.  Assessment/Plan: 1. Subcapital fracture left femoral neck. Status post surgery 6/11. Management per orthopedics including DVT prophylaxis. 2. Elevated transaminases and total bilirubin. New finding. Etiology unclear. Asymptomatic. 3. Atrial fibrillation, transient. Likely related to pain and fall. Currently in sinus rhythm. CHADSVASc score 4. CHADS2 2. Will monitor on telemetry but would advise against anticoagulation unless recurs. 4. Chronic kidney disease stage III, stable.   Doing well postoperatively.management per orthopedics.  Check CMP in the morning.  Moderate telemetry. If remains in sinus rhythm, discontinue telemetry 6/12.  Code Status: DNR DVT prophylaxis: heparin Family Communication: daughter Zigmund Choua Ikner at bedside Disposition Plan: pending PT recs  Murray Hodgkins, MD  [facility]  Pager [phone] If 7PM-7AM, please contact night-coverage at [email or web address], password [facility] 03/06/2015, 5:55 PM  LOS: 1 day   Consultants:    Procedures:  depuy TITANIUM cannulated screws x 3 with 2 washers   Antibiotics:    HPI/Subjective: Now in SR.  Confused. Daughter at bedside. No pain.  Objective: Filed Vitals:   03/06/15 1250 03/06/15 1349 03/06/15 1450 03/06/15 1551  BP: 119/48 101/49 112/55 110/58  Pulse: 50 65 60 55  Temp: 98.3 F (36.8 C) 98.7 F (37.1 C) 98 F (36.7 C) 97.6 F (36.4 C)  TempSrc: Oral Oral Oral Oral  Resp: 16 18 18 18   Height:      Weight:      SpO2: 93% 96% 97% 96%    Intake/Output Summary (Last 24 hours) at 03/06/15 1755 Last data filed at 03/06/15 1225  Gross per 24 hour  Intake    740 ml  Output   1175 ml  Net   -435 ml     Filed Weights   03/05/15 1638    Weight: 51.8 kg (114 lb 3.2 oz)    Exam:     Tm100.5, VSS stable General:  Appears calm and comfortable, mildly confused. Cardiovascular: RRR, no m/r/g. No LE edema. Telemetry: SR, no arrhythmias  Respiratory: CTA bilaterally, no w/r/r. Normal respiratory effort. Abdomen: soft, ntnd Musculoskeletal: grossly normal tone BUE/BLE, moves all extremities Psychiatric: grossly normal mood and affect, speech fluent and appropriate Neurologic: grossly non-focal.  New data reviewed:  Complete metabolic panel notable for AST 265, ALT 300, total bilirubin 2.0.  CBC unremarkable.  Pertinent data since admission:    Pending data:    Scheduled Meds: . acetaminophen  1,000 mg Intravenous 4 times per day  . [START ON 03/07/2015] aspirin EC  325 mg Oral Q breakfast  .  ceFAZolin (ANCEF) IV  2 g Intravenous Q6H  . Chlorhexidine Gluconate Cloth  6 each Topical Q0600  . docusate sodium  100 mg Oral BID  . latanoprost  1 drop Left Eye QHS  . metoprolol tartrate  12.5 mg Oral BID  . mupirocin ointment  1 application Nasal BID  . polyethylene glycol  17 g Oral Daily  . sodium chloride  3 mL Intravenous Q12H  . traMADol  50 mg Oral 4 times per day   Continuous Infusions: . sodium chloride 75 mL/hr at 03/06/15 1235    Principal Problem:   Fracture of femoral neck, left Active Problems:   A-fib   CKD (chronic kidney disease), stage III   Hypertension   Left displaced femoral neck fracture  Elevated LFTs   Time spent 20 minutes

## 2015-03-06 NOTE — Anesthesia Postprocedure Evaluation (Signed)
  Anesthesia Post-op Note  Patient: Elizabeth Pierce  Procedure(s) Performed: Procedure(s): CANNULATED HIP PINNING (Left)  Patient Location: Nursing Unit  Anesthesia Type:Spinal  Level of Consciousness: awake and alert   Airway and Oxygen Therapy: Patient Spontanous Breathing  Post-op Pain: none  Post-op Assessment: Post-op Vital signs reviewed LLE Motor Response: Purposeful movement LLE Sensation: Numbness RLE Motor Response: Purposeful movement RLE Sensation: Numbness L Sensory Level: T11 R Sensory Level: T11    Post-op Vital Signs: Reviewed and stable  Last Vitals:  Filed Vitals:   03/06/15 1250  BP: 119/48  Pulse: 50  Temp: 36.8 C  Resp: 16    Complications: No apparent anesthesia complications

## 2015-03-06 NOTE — Transfer of Care (Signed)
Immediate Anesthesia Transfer of Care Note  Patient: Elizabeth Pierce  Procedure(s) Performed: Procedure(s): CANNULATED HIP PINNING (Left)  Patient Location: PACU  Anesthesia Type:Spinal  Level of Consciousness: awake, alert  and patient cooperative  Airway & Oxygen Therapy: Patient Spontanous Breathing and Patient connected to nasal cannula oxygen  Post-op Assessment: Report given to RN and Post -op Vital signs reviewed and stable. T 6  Post vital signs: Reviewed and stable    Complications: No apparent anesthesia complications

## 2015-03-06 NOTE — Progress Notes (Signed)
Pt telemetry placed on standby and removed, pt voided 200 ml, scd's on.  Pt transported to OR for procedure, via bed with chart.

## 2015-03-07 LAB — CBC
HCT: 29.3 % — ABNORMAL LOW (ref 36.0–46.0)
HEMOGLOBIN: 9.7 g/dL — AB (ref 12.0–15.0)
MCH: 29.1 pg (ref 26.0–34.0)
MCHC: 33.1 g/dL (ref 30.0–36.0)
MCV: 88 fL (ref 78.0–100.0)
Platelets: 168 10*3/uL (ref 150–400)
RBC: 3.33 MIL/uL — ABNORMAL LOW (ref 3.87–5.11)
RDW: 14 % (ref 11.5–15.5)
WBC: 9.7 10*3/uL (ref 4.0–10.5)

## 2015-03-07 LAB — COMPREHENSIVE METABOLIC PANEL
ALT: 111 U/L — ABNORMAL HIGH (ref 14–54)
AST: 81 U/L — ABNORMAL HIGH (ref 15–41)
Albumin: 2.9 g/dL — ABNORMAL LOW (ref 3.5–5.0)
Alkaline Phosphatase: 89 U/L (ref 38–126)
Anion gap: 8 (ref 5–15)
BUN: 15 mg/dL (ref 6–20)
CALCIUM: 8.1 mg/dL — AB (ref 8.9–10.3)
CO2: 23 mmol/L (ref 22–32)
CREATININE: 1.07 mg/dL — AB (ref 0.44–1.00)
Chloride: 105 mmol/L (ref 101–111)
GFR, EST AFRICAN AMERICAN: 50 mL/min — AB (ref >60)
GFR, EST NON AFRICAN AMERICAN: 43 mL/min — AB (ref >60)
GLUCOSE: 121 mg/dL — AB (ref 65–99)
Potassium: 3.7 mmol/L (ref 3.5–5.1)
Sodium: 136 mmol/L (ref 135–145)
TOTAL PROTEIN: 5.9 g/dL — AB (ref 6.5–8.1)
Total Bilirubin: 0.8 mg/dL (ref 0.3–1.2)

## 2015-03-07 MED ORDER — HEPARIN SODIUM (PORCINE) 5000 UNIT/ML IJ SOLN: 5000.0000 [IU] | Freq: Three times a day (TID) | INTRAMUSCULAR | Status: DC

## 2015-03-07 MED ORDER — ACETAMINOPHEN 325 MG PO TABS
650.0000 mg | ORAL_TABLET | Freq: Four times a day (QID) | ORAL | Status: DC | PRN
  Administered 2015-03-07 – 2015-03-08 (×4): 650 mg via ORAL
  Filled 2015-03-07 (×4): qty 2

## 2015-03-07 MED ORDER — SENNA 8.6 MG PO TABS
1.0000 | ORAL_TABLET | Freq: Every day | ORAL | Status: DC
  Administered 2015-03-07: 8.6 mg via ORAL
  Filled 2015-03-07: qty 1

## 2015-03-07 MED ORDER — POLYETHYLENE GLYCOL 3350 17 G PO PACK
17.0000 g | PACK | Freq: Two times a day (BID) | ORAL | Status: DC
  Administered 2015-03-07 – 2015-03-08 (×2): 17 g via ORAL
  Filled 2015-03-07 (×2): qty 1

## 2015-03-07 NOTE — Evaluation (Signed)
Physical Therapy Evaluation Patient Details Name: Elizabeth Pierce MRN: [id number] DOB: 1918/10/02 Today's Date: 03/07/2015   History of Present Illness  Pt is a pleasant 81yo White female who presents on POD1 s/p L hip ORIF. Pt slipped on some water in Kitchen at home and sustained a fall, and although initially s major c/o pain, demonstrated difficulty with WB activities and AMB. Pt arrived at [facility] and after examination was found to have sustain a subcaital fraxtur of the L femur. Pt/family elected to undergo ORIF to correct Fx.   Clinical Impression  Pt is pleasant and conversational with stable c/o pain throughout session. Pt demonstrating good strength as evidenced by ability to perform majority of HEP with minimal-to-no assistance; however, pt is notably presenting with weakness in abdominals/ trunk flexion requiring ModA for supine to sit, inability to bring self to neurtral position while seated and requiring maxA to bring hips and trunk forward during upright stance. Patient presenting with impairment of strength, pain, range of motion, balance, and activity tolerance, limiting ability to perform ADL and mobility tasks at  baseline level of function. Patient will benefit from skilled intervention to address the above impairments and limitations, in order to restore to prior level of function, improve patient safety upon discharge, and to decrease caregiver burden.      Follow Up Recommendations SNF    Equipment Recommendations  None recommended by PT (To be determined by DC facility. )    Recommendations for Other Services       Precautions / Restrictions Precautions Precautions: Fall Precaution Comments: Pt is s/p falla t home and s/p operative procedure.  Restrictions Weight Bearing Restrictions: Yes LLE Weight Bearing: Weight bearing as tolerated      Mobility  Bed Mobility Overal bed mobility: Needs Assistance Bed Mobility: Supine to Sit     Supine to sit: Mod assist      General bed mobility comments: Pt requires minA for scooting hips laterally to bed side and managing LLE off of bed, and ModA for trunk righting, sitting up. Pt able to utilize BUE to pull self up with [place].   Transfers Overall transfer level: Needs assistance Equipment used: Rolling walker (2 wheeled) Transfers: Sit to/from [facility] Sit to Stand: Min assist Stand pivot transfers: Max assist (Pt c excesive posterior lean, able to take small steps with ModA for weight shifting. )       General transfer comment: Able to come up 85%, but unable to achieve full upright posture c or s addistance. Pt fearful of falling and continues to exhibit trunk extension, correcting only 50% with exhaustive verbal and tactile cues.   Ambulation/Gait Ambulation/Gait assistance:  (unable to perform at this time due to transfer status. )              Stairs            Wheelchair Mobility    Modified Rankin (Stroke Patients Only)       Balance Overall balance assessment: Needs assistance Sitting-balance support: Bilateral upper extremity supported Sitting balance-Leahy Scale: Poor Sitting balance - Comments: Mostly due to inability to achieve neutral posture.    Standing balance support: Bilateral upper extremity supported Standing balance-Leahy Scale: Poor Standing balance comment: Mostly due to inability to achieve neutral posture.                              Pertinent Vitals/Pain Pain Assessment: 0-10 Pain Score:  5  Pain Location: L hip  Pain Descriptors / Indicators: Aching Pain Intervention(s): Monitored during session;Premedicated before session;Ice applied;Other (comment) (Pain stable throughout activity. )    Home Living Family/patient expects to be discharged to:: Private residence Living Arrangements: Alone Available Help at Discharge: Family Type of Home: Apartment Home Access: Stairs to enter Entrance Stairs-Rails: None Entrance  Stairs-Number of Steps: sinlge partial step at threshold Home Layout: One level Home Equipment: Environmental consultant - 4 wheels      Prior Function Level of Independence: Needs assistance   Gait / Transfers Assistance Needed: Pt modI with household amb, but self limited in comunity distances. Pt able to perform limited community distances (up to 1072ft) with HHA from family s AD.    ADL's / Homemaking Assistance Needed: Pt performs daily sponge baths daily, and daughters assist with showers. Daughters prepare meals, and pt is able to heat and self feed.         Hand Dominance        Extremity/Trunk Assessment   Upper Extremity Assessment: Overall WFL for tasks assessed           Lower Extremity Assessment: Overall WFL for tasks assessed      Cervical / Trunk Assessment:  (Potentially some abdominal weakness, although fear avoidance of neutralposture would not allow thorough investigation. )  Communication   Communication: HOH  Cognition Arousal/Alertness: Awake/alert Behavior During Therapy: WFL for tasks assessed/performed Overall Cognitive Status: Within Functional Limits for tasks assessed                      General Comments      Exercises Total Joint Exercises Ankle Circles/Pumps: AROM;Both;15 reps;Supine Short Arc Quad: AROM;Strengthening;Supine;15 reps;Left Heel Slides: AAROM;Left;15 reps;Supine Hip ABduction/ADduction: AAROM;Left;15 reps;Supine Straight Leg Raises: AAROM;Strengthening;Left;15 reps;Supine      Assessment/Plan    PT Assessment Patient needs continued PT services  PT Diagnosis Difficulty walking;Abnormality of gait;Generalized weakness;Acute pain   PT Problem List Decreased strength;Decreased range of motion;Decreased knowledge of use of DME;Decreased activity tolerance;Decreased safety awareness;Decreased balance;Decreased mobility;Pain  PT Treatment Interventions DME instruction;Gait training;Balance training;Neuromuscular  re-education;Functional mobility training;Patient/family education;Therapeutic activities;Therapeutic exercise   PT Goals (Current goals can be found in the Care Plan section) Acute Rehab PT Goals Patient Stated Goal: STR followed by DC to daughters home, with eventual return to pt residence with continued support PRN  PT Goal Formulation: With patient/family Time For Goal Achievement: 03/21/15 Potential to Achieve Goals: Good    Frequency BID   Barriers to discharge        Co-evaluation               End of Session Equipment Utilized During Treatment: Gait belt Activity Tolerance: Patient tolerated treatment well;Patient limited by pain;Other (comment) (Limited by fear of falling as well as lack of confidence in painful sensation upon WB on LLE.) Patient left: in chair;with call bell/phone within reach;with family/visitor present Nurse Communication: Mobility status;Weight bearing status;Precautions         Time: 2707-8675 PT Time Calculation (min) (ACUTE ONLY): 46 min   Charges:   PT Evaluation $Initial PT Evaluation Tier I: 1 Procedure PT Treatments $Therapeutic Exercise: 8-22 mins   PT G Codes:        Buccola,Allan C 2015/03/20, 12:05 PM  12:08 PM  Etta Grandchild, PT, DPT [facility] License # [id number]

## 2015-03-07 NOTE — Progress Notes (Signed)
Foley catheter removed per orders, tip intact.  Instructed patient and her daughter to call for assistance when patient feels need to void.  Understanding verbalized.

## 2015-03-07 NOTE — Progress Notes (Signed)
Dr Aline Brochure notified about whether to start patient on heparin. No new orders for Heparin.

## 2015-03-07 NOTE — Progress Notes (Signed)
  PROGRESS NOTE  ADAMARI FREDE CLE:[id number] DOB: Mar 22, 1919 DOA: 03/05/2015 PCP: Delphina Cahill, MD  Summary:  81 year old woman who lives alone, slipped on some water and fell sustaining a hip fracture. EKG revealed atrial fibrillation.she underwent successful surgery 6/11.  Assessment/Plan: 1. Subcapital fracture left femoral neck. Status post surgery 6/11. Management per orthopedics including DVT prophylaxis. 2. ABLA secondary to surgery. Trend CBC 3. Elevated transaminases and total bilirubin. New finding. Etiology unclear but asymptomatic and quickly resolving. 4. Atrial fibrillation, transient. Likely related to pain and fall. Currently in sinus rhythm. CHADSVASc score 4. CHADS2 2. Will monitor on telemetry but would advise against anticoagulation unless recurs. 5. Chronic kidney disease stage III, stable.   Management per orthopedics.  Trend Hgb. CBC in AM  Stop telemetry  Anticipate SNF 6/13  Code Status: DNR DVT prophylaxis: heparin Family Communication: discussed with daughter [name] Disposition Plan: pending PT recs  Murray Hodgkins, MD  [facility]  Pager [phone] If 7PM-7AM, please contact night-coverage at [email or web address], password [email or web address] 03/07/2015, 11:00 AM  LOS: 2 days   Consultants:    Procedures:  depuy TITANIUM cannulated screws x 3 with 2 washers   Antibiotics:    HPI/Subjective: Complains of pain in left leg between hip and knee. No chest pain or SOB. Eating well.  Objective: Filed Vitals:   03/06/15 1450 03/06/15 1551 03/06/15 2127 03/07/15 0620  BP: 112/55 110/58 112/63 125/53  Pulse: 60 55 71 64  Temp: 98 F (36.7 C) 97.6 F (36.4 C) 98.2 F (36.8 C) 99.5 F (37.5 C)  TempSrc: Oral Oral Oral Oral  Resp: 18 18 18 17   Height:      Weight:      SpO2: 97% 96% 95% 95%    Intake/Output Summary (Last 24 hours) at 03/07/15 1100 Last data filed at 03/07/15 0925  Gross per 24 hour  Intake 2068.75 ml  Output   2200 ml  Net -131.25 ml      Filed Weights   03/05/15 1638  Weight: 51.8 kg (114 lb 3.2 oz)    Exam:    Afebrile, VSS General:  Appears comfortable, calm. Cardiovascular: Regular rate and rhythm, no murmur, rub or gallop. No lower extremity edema. DP 2+ left foot. Telemetry: Sinus rhythm, no arrhythmias  Respiratory: Clear to auscultation bilaterally, no wheezes, rales or rhonchi. Normal respiratory effort. Musculoskeletal: grossly normal tone bilateral upper and lower extremities Psychiatric: grossly normal mood and affect, speech fluent and appropriate  New data reviewed:  Complete metabolic panel notable for AST 81, ALT 111, total bilirubin .8, all improved.  Hgb down to 9.7  Pertinent data since admission:    Pending data:  BC, UC  Scheduled Meds: . aspirin EC  325 mg Oral Q breakfast  . Chlorhexidine Gluconate Cloth  6 each Topical Q0600  . docusate sodium  100 mg Oral BID  . latanoprost  1 drop Left Eye QHS  . metoprolol tartrate  12.5 mg Oral BID  . mupirocin ointment  1 application Nasal BID  . polyethylene glycol  17 g Oral Daily  . sodium chloride  3 mL Intravenous Q12H   Continuous Infusions: . sodium chloride 75 mL/hr at 03/06/15 1235    Principal Problem:   Fracture of femoral neck, left Active Problems:   A-fib   CKD (chronic kidney disease), stage III   Hypertension   Left displaced femoral neck fracture   Elevated LFTs   Time spent 20 minutes

## 2015-03-08 ENCOUNTER — Inpatient Hospital Stay
Admission: RE | Admit: 2015-03-08 | Discharge: 2015-04-21 | Disposition: A | Discharge status: Home / Self Care | Payer: PRIVATE HEALTH INSURANCE | Source: Ambulatory Visit | Attending: Internal Medicine | Admitting: Internal Medicine

## 2015-03-08 DIAGNOSIS — Z9181 History of falling: Secondary | ICD-10-CM | POA: Diagnosis not present

## 2015-03-08 DIAGNOSIS — M6281 Muscle weakness (generalized): Secondary | ICD-10-CM | POA: Diagnosis not present

## 2015-03-08 DIAGNOSIS — S72002D Fracture of unspecified part of neck of left femur, subsequent encounter for closed fracture with routine healing: Secondary | ICD-10-CM

## 2015-03-08 DIAGNOSIS — D649 Anemia, unspecified: Secondary | ICD-10-CM | POA: Diagnosis not present

## 2015-03-08 DIAGNOSIS — N179 Acute kidney failure, unspecified: Secondary | ICD-10-CM

## 2015-03-08 DIAGNOSIS — T148XXA Other injury of unspecified body region, initial encounter: Principal | ICD-10-CM

## 2015-03-08 DIAGNOSIS — N183 Chronic kidney disease, stage 3 (moderate): Secondary | ICD-10-CM | POA: Diagnosis not present

## 2015-03-08 DIAGNOSIS — R278 Other lack of coordination: Secondary | ICD-10-CM | POA: Diagnosis not present

## 2015-03-08 DIAGNOSIS — N390 Urinary tract infection, site not specified: Secondary | ICD-10-CM | POA: Diagnosis not present

## 2015-03-08 DIAGNOSIS — S72012D Unspecified intracapsular fracture of left femur, subsequent encounter for closed fracture with routine healing: Secondary | ICD-10-CM | POA: Diagnosis not present

## 2015-03-08 DIAGNOSIS — Z4889 Encounter for other specified surgical aftercare: Secondary | ICD-10-CM | POA: Diagnosis not present

## 2015-03-08 DIAGNOSIS — R400 Somnolence: Secondary | ICD-10-CM | POA: Diagnosis not present

## 2015-03-08 DIAGNOSIS — B9620 Unspecified Escherichia coli [E. coli] as the cause of diseases classified elsewhere: Secondary | ICD-10-CM | POA: Diagnosis not present

## 2015-03-08 DIAGNOSIS — I4891 Unspecified atrial fibrillation: Secondary | ICD-10-CM | POA: Diagnosis not present

## 2015-03-08 DIAGNOSIS — I482 Chronic atrial fibrillation: Secondary | ICD-10-CM | POA: Diagnosis not present

## 2015-03-08 DIAGNOSIS — Z4789 Encounter for other orthopedic aftercare: Secondary | ICD-10-CM | POA: Diagnosis not present

## 2015-03-08 DIAGNOSIS — R7989 Other specified abnormal findings of blood chemistry: Secondary | ICD-10-CM

## 2015-03-08 DIAGNOSIS — R488 Other symbolic dysfunctions: Secondary | ICD-10-CM | POA: Diagnosis not present

## 2015-03-08 DIAGNOSIS — I10 Essential (primary) hypertension: Secondary | ICD-10-CM | POA: Diagnosis not present

## 2015-03-08 DIAGNOSIS — H409 Unspecified glaucoma: Secondary | ICD-10-CM | POA: Diagnosis not present

## 2015-03-08 DIAGNOSIS — I9589 Other hypotension: Secondary | ICD-10-CM | POA: Diagnosis not present

## 2015-03-08 DIAGNOSIS — T148 Other injury of unspecified body region: Secondary | ICD-10-CM | POA: Diagnosis not present

## 2015-03-08 DIAGNOSIS — R262 Difficulty in walking, not elsewhere classified: Secondary | ICD-10-CM | POA: Diagnosis not present

## 2015-03-08 LAB — CBC
HCT: 28.7 % — ABNORMAL LOW (ref 36.0–46.0)
Hemoglobin: 9.5 g/dL — ABNORMAL LOW (ref 12.0–15.0)
MCH: 29.1 pg (ref 26.0–34.0)
MCHC: 33.1 g/dL (ref 30.0–36.0)
MCV: 88 fL (ref 78.0–100.0)
PLATELETS: 173 10*3/uL (ref 150–400)
RBC: 3.26 MIL/uL — ABNORMAL LOW (ref 3.87–5.11)
RDW: 13.9 % (ref 11.5–15.5)
WBC: 8.5 10*3/uL (ref 4.0–10.5)

## 2015-03-08 LAB — URINE CULTURE: Colony Count: >=100000

## 2015-03-08 LAB — COMPREHENSIVE METABOLIC PANEL
ALT: 33 U/L (ref 14–54)
ANION GAP: 6 (ref 5–15)
AST: 40 U/L (ref 15–41)
Albumin: 3 g/dL — ABNORMAL LOW (ref 3.5–5.0)
Alkaline Phosphatase: 84 U/L (ref 38–126)
BUN: 17 mg/dL (ref 6–20)
CALCIUM: 8.4 mg/dL — AB (ref 8.9–10.3)
CO2: 27 mmol/L (ref 22–32)
Chloride: 106 mmol/L (ref 101–111)
Creatinine, Ser: 1.1 mg/dL — ABNORMAL HIGH (ref 0.44–1.00)
GFR calc Af Amer: 49 mL/min — ABNORMAL LOW (ref >60)
GFR, EST NON AFRICAN AMERICAN: 42 mL/min — AB (ref >60)
Glucose, Bld: 101 mg/dL — ABNORMAL HIGH (ref 65–99)
Potassium: 3.7 mmol/L (ref 3.5–5.1)
Sodium: 139 mmol/L (ref 135–145)
TOTAL PROTEIN: 6.2 g/dL — AB (ref 6.5–8.1)
Total Bilirubin: 0.9 mg/dL (ref 0.3–1.2)

## 2015-03-08 MED ORDER — CEPHALEXIN 500 MG PO CAPS: 500.0000 mg | ORAL_CAPSULE | Freq: Two times a day (BID) | ORAL | Status: AC

## 2015-03-08 MED ORDER — METHOCARBAMOL 500 MG PO TABS: 500.0000 mg | ORAL_TABLET | Freq: Three times a day (TID) | ORAL | Status: DC | PRN

## 2015-03-08 MED ORDER — BISACODYL 10 MG RE SUPP: 10.0000 mg | Freq: Every day | RECTAL | Status: DC | PRN

## 2015-03-08 MED ORDER — FERROUS SULFATE 325 (65 FE) MG PO TABS: 325.0000 mg | ORAL_TABLET | Freq: Two times a day (BID) | ORAL | Status: DC

## 2015-03-08 MED ORDER — HYDROCODONE-ACETAMINOPHEN 5-325 MG PO TABS: 1.0000 | ORAL_TABLET | Freq: Four times a day (QID) | ORAL | Status: DC | PRN

## 2015-03-08 MED ORDER — ASPIRIN 325 MG PO TBEC: 325.0000 mg | DELAYED_RELEASE_TABLET | Freq: Every day | ORAL | Status: AC

## 2015-03-08 NOTE — Progress Notes (Signed)
Report called to [facility]. 

## 2015-03-08 NOTE — Progress Notes (Signed)
Patient up in the recliner chair tolerated well.

## 2015-03-08 NOTE — Addendum Note (Signed)
Addendum  created 03/08/15 0746 by Mickel Baas, CRNA   Modules edited: Notes Section   Notes Section:  File: [id number]

## 2015-03-08 NOTE — Anesthesia Postprocedure Evaluation (Signed)
  Anesthesia Post-op Note  Patient: Elizabeth Pierce  Procedure(s) Performed: Procedure(s): CANNULATED HIP PINNING (Left)  Patient Location: Room 315  Anesthesia Type:Spinal  Level of Consciousness: awake, alert , oriented and patient cooperative  Airway and Oxygen Therapy: Patient Spontanous Breathing  Post-op Pain: mild  Post-op Assessment: Post-op Vital signs reviewed, Patient's Cardiovascular Status Stable, Respiratory Function Stable, Patent Airway, No signs of Nausea or vomiting, Pain level controlled and No headache LLE Motor Response: Purposeful movement LLE Sensation: Full sensation RLE Motor Response: Purposeful movement RLE Sensation: Full sensation L Sensory Level: T11 R Sensory Level: T11  Post-op Vital Signs: Reviewed and stable  Last Vitals:  Filed Vitals:   03/08/15 0611  BP: 131/56  Pulse: 62  Temp: 37.4 C  Resp: 20    Complications: No apparent anesthesia complications

## 2015-03-08 NOTE — Care Management Note (Signed)
Case Management Note  Patient Details  Name: Elizabeth Pierce MRN: [id number] Date of Birth: 09/27/1918  Subjective/Objective:                  Pt admitted from home alone with hip fracture. Anticipate pt will need SNF at discharge.  Action/Plan: Pt for discharge today to SNF. CSW is aware and will arrange discharge once SNF bed available.  Expected Discharge Date:                  Expected Discharge Plan:  Skilled Nursing Facility  In-House Referral:  Clinical Social Work  Discharge planning Services  CM Consult  Post Acute Care Choice:  NA Choice offered to:  NA  DME Arranged:    DME Agency:     HH Arranged:    [place] Agency:     Status of Service:  Completed, signed off  Medicare Important Message Given:  Yes Date Medicare IM Given:  03/08/15 Medicare IM give by:  Christinia Gully, RN BSN CM Date Additional Medicare IM Given:    Additional Medicare Important Message give by:     If discussed at [place] of Stay Meetings, dates discussed:    Additional Comments:  Joylene Draft, RN 03/08/2015, 10:35 AM

## 2015-03-08 NOTE — Progress Notes (Addendum)
Physical Therapy Treatment Patient Details Name: Elizabeth Pierce MRN: [id number] DOB: Sep 20, 1919 Today's Date: 03/08/2015    History of Present Illness Pt is a pleasant 81yo White female who presents on POD1 s/p L hip ORIF. Pt slipped on some water in [place] at home and sustained a fall, and although initially s major c/o pain, demonstrated difficulty with WB activities and AMB. Pt arrived at [facility] and after examination was found to have sustain a subcaital fraxtur of the L femur. Pt/family elected to undergo ORIF to correct Fx.     PT Comments    Pt was seen for 2nd visit.  She was alert and oriented, no hip pain at rest.  Daughters were present and indicated that pt is much more clear headed today.  Her mobility status has improved significantly along with her cognition.  She was able to perform all exercise actively with RLE and A/AA with LLE.  She was able to transfer to EOB with min assist and was able to hold sitting with no outside support.  She was instructed in sit to stand transfer from bed, BSC and chair, needing only min assist.  She was instructed in gait with a walker WBAT L and was able to ambulate 4'.  She did report mod pain in the left hip with weight bearing so she was instructed again in offloading weight onto her UEs.  She is now up in a chair and very comfortable.  Follow Up Recommendations  SNF     Equipment Recommendations  None recommended by PT    Recommendations for Other Services  none     Precautions / Restrictions Precautions Precautions: Fall Precaution Comments: Pt is s/p falla t home and s/p operative procedure.  Restrictions LLE Weight Bearing: Weight bearing as tolerated    Mobility  Bed Mobility Overal bed mobility: Needs Assistance Bed Mobility: Supine to Sit     Supine to sit: Min assist;HOB elevated     General bed mobility comments: sitting balance is much improved today, she is able to mobilize both LEs laterally to get to  sitting  Transfers Overall transfer level: Needs assistance Equipment used: Rolling walker (2 wheeled) Transfers: Sit to/from [facility] Sit to Stand: Min assist Stand pivot transfers: Min assist       General transfer comment: pt is able to achieve full stance today with a walker and maintain balance...she was instructed in pivot transfer bed to [facility]  Ambulation/Gait Ambulation/Gait assistance: Min guard Ambulation Distance (Feet): 4 Feet Assistive device: Rolling walker (2 wheeled) Gait Pattern/deviations: Shuffle;Decreased stride length Gait velocity: appropriate for situation                            Balance Overall balance assessment: Needs assistance Sitting-balance support: Feet supported;Bilateral upper extremity supported Sitting balance-Leahy Scale: Good     Standing balance support: Bilateral upper extremity supported Standing balance-Leahy Scale: Fair                      Cognition Arousal/Alertness: Awake/alert Behavior During Therapy: WFL for tasks assessed/performed Overall Cognitive Status: Within Functional Limits for tasks assessed                      Exercises General Exercises - Lower Extremity Ankle Circles/Pumps: AROM;Both;Supine;10 reps Quad Sets: AROM;Both;10 reps;Supine Gluteal Sets: AROM;Both;10 reps;Supine Short Arc Quad: AROM;Both;10 reps;Supine Heel Slides: AAROM;AROM;Both;10 reps;Supine Hip ABduction/ADduction: AROM;AAROM;Both;10 reps;Supine  Pertinent Vitals/Pain Pain Assessment: No/denies pain                                      PT Goals (current goals can now be found in the care plan section) Progress towards PT goals: Progressing toward goals    Frequency  BID    PT Plan Current plan remains appropriate    Co-evaluation             End of Session Equipment Utilized During Treatment: Gait belt Activity Tolerance: Patient tolerated  treatment well Patient left: in chair;with call bell/phone within reach;with family/visitor present     Time: 0822-0920 PT Time Calculation (min) (ACUTE ONLY): 58 min  Charges:  $Gait Training: 8-22 mins $Therapeutic Exercise: 8-22 mins $Therapeutic Activity: 8-22 mins                    G CodesSable Feil  PT 03/08/2015, 9:33 AM [phone]

## 2015-03-08 NOTE — Progress Notes (Signed)
Postop day 2 status post open treatment internal fixation cannulated screws left hip  Patient doing well. Patient can be discharged from orthopedic standpoint and any point please see prior progress note for instructions

## 2015-03-08 NOTE — Clinical Social Work Placement (Signed)
   CLINICAL SOCIAL WORK PLACEMENT  NOTE  Date:  03/08/2015  Patient Details  Name: MARJARIE IRION MRN: [id number] Date of Birth: 08/19/1919  Clinical Social Work is seeking post-discharge placement for this patient at the [place] level of care (*CSW will initial, date and re-position this form in  chart as items are completed):  Yes   Patient/family provided with [facility] Work Department's list of facilities offering this level of care within the geographic area requested by the patient (or if unable, by the patient's family).  Yes   Patient/family informed of their freedom to choose among providers that offer the needed level of care, that participate in Medicare, Medicaid or managed care program needed by the patient, have an available bed and are willing to accept the patient.  Yes   Patient/family informed of [facility]'s ownership interest in [facility] and [facility], as well as of the fact that they are under no obligation to receive care at these facilities.  PASRR submitted to EDS on 03/08/15     PASRR number received on 03/08/15     Existing PASRR number confirmed on       FL2 transmitted to all facilities in geographic area requested by pt/family on 03/08/15     FL2 transmitted to all facilities within larger geographic area on       Patient informed that his/her managed care company has contracts with or will negotiate with certain facilities, including the following:        Yes   Patient/family informed of bed offers received.  Patient chooses bed at [facility]     Physician recommends and patient chooses bed at      Patient to be transferred to [facility] on 03/08/15.  Patient to be transferred to facility by staff     Patient family notified on 03/08/15 of transfer.  Name of family member notified:  Judy/Kay- daughters     PHYSICIAN       Additional Comment:     _______________________________________________ Salome Arnt, [place] 03/08/2015, 11:43 AM [phone]

## 2015-03-08 NOTE — Progress Notes (Signed)
Hip fracture   Staples out POD 12 June 23  wbat  Aspirin x 28 days stop July 9th F/u 1 month  xrays on follow up

## 2015-03-08 NOTE — Clinical Social Work Note (Signed)
Clinical Social Work Assessment  Patient Details  Name: Elizabeth Pierce MRN: [id number] Date of Birth: 17-Sep-1919  Date of referral:  03/08/15               Reason for consult:  Facility Placement                Permission sought to share information with:  Family Supports Permission granted to share information::  Yes, Verbal Permission Granted  Name::     Elizabeth Pierce  Agency::     Relationship::  daughters  Contact Information:     Housing/Transportation Living arrangements for the past 2 months:  Single Family Home Source of Information:  Patient, Adult Children Patient Interpreter Needed:  None Criminal Activity/Legal Involvement Pertinent to Current Situation/Hospitalization:  No - Comment as needed Significant Relationships:  Adult Children, Other Family Members Lives with:  Self Do you feel safe going back to the place where you live?  No (will require rehab ) Need for family participation in patient care:  Yes (Comment)  Care giving concerns:  Pt lives alone.    Social Worker assessment / plan:  CSW met with pt and daughters, Elizabeth Pierce and Elizabeth Pierce at bedside. Pt alert and oriented and reports she lives alone. She was in her kitchen on Friday and fell. Pt was able to scoot across the floor to the phone to call her family. Admitted with hip fracture. Today, pt is post-op day 2 and ready for d/c to SNF. Pt is independent with ADLs at baseline. She was planning on going to the beach this week with her family and is looking forward to hopefully going later this summer. CSW discussed placement process and provided SNF list. Pt and family aware of Medicare coverage/criteria. Requesting [place] if available. Family is very involved and supportive. Daughters are both retired and encouraged pt that they will take pt home as soon as she is ready.   Employment status:  Retired Forensic scientist:  Medicare PT Recommendations:  [place] / Referral to community resources:   [place]  Patient/Family's Response to care:  Pt and family very positive regarding short term rehab and are very encouraging to pt.   Patient/Family's Understanding of and Emotional Response to Diagnosis, Current Treatment, and Prognosis:  Pt and family have good understanding of pt's needs after d/c. Pt states she has been very healthy and this is a new experience for her. CSW provided support.   Emotional Assessment Appearance:  Appears younger than stated age Attitude/Demeanor/Rapport:  Other (very pleasant) Affect (typically observed):  Appropriate, Hopeful Orientation:  Oriented to Self, Oriented to Place, Oriented to  Time, Oriented to Situation Alcohol / Substance use:  Not Applicable Psych involvement (Current and /or in the community):  No (Comment)  Discharge Needs  Concerns to be addressed:  Discharge Planning Concerns Readmission within the last 30 days:  No Current discharge risk:  Lives alone Barriers to Discharge:  No Barriers Identified   Salome Arnt, [place] 03/08/2015, 10:18 AM [phone]

## 2015-03-08 NOTE — Discharge Summary (Addendum)
Physician Discharge Summary  Elizabeth Pierce QVZ:[id number] DOB: 11/09/1918 DOA: 03/05/2015  PCP: Delphina Cahill, MD  Admit date: 03/05/2015 Discharge date: 03/08/2015  Time spent: 35 minutes  Recommendations for Outpatient Follow-up:  1. Discharged to skilled nursing facility. Follow-up with orthopedic surgeon Dr. Aline Brochure in 2 weeks. 2. Please follow urine cx and sensitivity. Pt will complete 5 day of  abx in 6/17   Discharge Diagnoses:  Principal Problem:   Fracture of femoral neck, left   Active Problems:   A-fib   CKD (chronic kidney disease), stage III   Hypertension   Elevated LFTs   UTI   Discharge Condition: fair  Diet recommendation: regular  Filed Weights   03/05/15 1638  Weight: 51.8 kg (114 lb 3.2 oz)    History of present illness:  81 year old woman with hx of HTN, mild CKD, who lives alone, slipped on some water and fell sustaining a hip fracture. EKG revealed atrial fibrillation and had transaminitis as well. .she underwent successful surgery 6/11.  Hospital Course:  Subcapital fracture left femoral neck.  -Status post surgery 6/11. (depuy TITANIUM cannulated screws x 3 with 2 washers ). Tolerated well. Now up with therapy and requiring minimal pain medication. -per ortho , pt may weight bear as tolerated, recommend ASA 325 mg daily for 30 days for DVT prophylaxis and -follow-up with orthopedics in 4 weeks with repeat x-rays. -Patient is hemodynamically stable. Has acquired minimal pain medications, he has been mostly controlled with Tylenol alone. I will discharge her on when necessary Vicodin as needed for severe pain. Added bowel regimen.  Acute blood loss anemia Full surgery. Will prescribe iron supplements.  Elevated transaminases and total bilirubin.  New finding. Etiology unclear but asymptomatic and has resolved.  Atrial fibrillation  transient. Likely related to pain and fall. Currently in sinus rhythm. CHADSVASc score 4. CHADS2 2. Does not need  anticoagulation at this time.   Hypotension Patient on amlodipine and atorvastatin. Blood pressure medications held yesterday. Remains soft today. I will resume losartan but hold amlodipine. Follow-up as outpatient.  ? UTI Urine cx growing GNR. Final cx and sensitivity pending. Asymptomatic. She had leukocytosis upon presentation. Will discharge her on Keflex for 5 days.     Code Status: DNR  Family Communication: discussed with daughters at bedside  Disposition Plan: SNF   Procedures:    Consultations:  Ortho ( Dr Aline Brochure)  Discharge Exam: Filed Vitals:   03/08/15 0611  BP: 131/56  Pulse: 62  Temp: 99.3 F (37.4 C)  Resp: 20    General: Elderly female in no acute distress HEENT: No pallor, no icterus, moist oral mucosa, supple neck Chest: Clear to auscultation bilaterally, no added sound CVS: normal S1 and S2, no murmurs rubs or gallop GI: soft, NT, ND, BS+ Musculoskeletal: Warm, no edema, dressing or left hip with improved range of motion CNS: Hard of hearing, Alert and oriented  Discharge Instructions    Current Discharge Medication List    START taking these medications   Details  aspirin EC 325 MG EC tablet Take 1 tablet (325 mg total) by mouth daily with breakfast. Qty: 28 tablet, Refills: 0    bisacodyl (DULCOLAX) 10 MG suppository Place 1 suppository (10 mg total) rectally daily as needed for moderate constipation. Qty: 12 suppository, Refills: 0    ferrous sulfate 325 (65 FE) MG tablet Take 1 tablet (325 mg total) by mouth 2 (two) times daily with a meal. Qty: 60 tablet, Refills: 0    HYDROcodone-acetaminophen (NORCO/VICODIN)  5-325 MG per tablet Take 1 tablet by mouth every 6 (six) hours as needed for moderate pain. Qty: 30 tablet, Refills: 0  cap cephalexin 500 mg bid                    Take 1 capsule twice daily for 5 days                                                                      Qty 10 tablets, 0 refills    CONTINUE these  medications which have NOT CHANGED   Details  COMBIGAN 0.2-0.5 % ophthalmic solution Place 1 drop into the left eye every 12 (twelve) hours.     docusate sodium (COLACE) 250 MG capsule Take 250 mg by mouth daily.    irbesartan (AVAPRO) 300 MG tablet Take 300 mg by mouth daily. Refills: 5    Multiple Vitamins-Minerals (MULTIVITAMIN WITH MINERALS) tablet Take 1 tablet by mouth daily.    simethicone (MYLICON) 80 MG chewable tablet Chew 80 mg by mouth every 6 (six) hours as needed for flatulence.    TRAVATAN Z 0.004 % SOLN ophthalmic solution Place 1 drop into the left eye every evening.     WELCHOL 3.75 G PACK Take 1 packet by mouth daily.       STOP taking these medications     amLODipine (NORVASC) 10 MG tablet        No Known Allergies Follow-up Information    Please follow up.   Why:  MD at SNF      Follow up with Arther Abbott, MD In 4 weeks.   Specialties:  Orthopedic Surgery, Radiology   Why:  office will call   Contact information:   [address] [address] [phone]        The results of significant diagnostics from this hospitalization (including imaging, microbiology, ancillary and laboratory) are listed below for reference.    Significant Diagnostic Studies: Dg Pelvis 1-2 Views  03/05/2015   CLINICAL DATA:  LEFT leg pain, fell this morning at home  EXAM: PELVIS - 1-2 VIEW  COMPARISON:  None  FINDINGS: Diffuse osseous demineralization.  Hip and SI joint spaces symmetric and preserved.  Minimally displaced subcapital fracture LEFT femoral neck.  No dislocation.  Pelvis appears intact.  Few pelvic phleboliths.  IMPRESSION: Subcapital fracture LEFT femoral neck, minimally displaced.   Electronically Signed   By: Lavonia Dana M.D.   On: 03/05/2015 12:25   Dg Chest Portable 1 View  03/05/2015   CLINICAL DATA:  Fall in the kitchen. Initial encounter.  EXAM: PORTABLE CHEST - 1 VIEW  COMPARISON:  08/13/2008  FINDINGS: Borderline  cardiomegaly, with size accentuated by rotation. Negative aortic and hilar contours for technique. Chronic mild coarsening of basilar lung markings. There is no edema, consolidation, effusion, or pneumothorax. No appreciable rib fracture.  IMPRESSION: No active disease.   Electronically Signed   By: Monte Fantasia M.D.   On: 03/05/2015 14:06   Dg Hip Operative Unilat With Pelvis Left  03/06/2015   CLINICAL DATA:  Left femoral fracture fixation.  EXAM: OPERATIVE LEFT HIP (WITH PELVIS IF PERFORMED) 2 VIEWS  TECHNIQUE: Fluoroscopic spot image(s) were submitted for interpretation post-operatively.  FLUOROSCOPY  TIME:  C-arm fluoroscopic images were obtained intraoperatively and submitted for post operative interpretation. Please see the performing provider's procedural report for the fluoroscopy time utilized.  COMPARISON:  03/05/2015  FINDINGS: Six intraoperative spot fluoroscopic images of the left hip (3 frontal and 3 lateral) are provided. These demonstrate interval internal reduction and internal fixation of the previously described subcapital femoral neck fracture with placement of 3 cannulated lag screws. Alignment appears anatomic. Hip is located.  IMPRESSION: Intraoperative images during ORIF of left femoral neck fracture.   Electronically Signed   By: Logan Bores   On: 03/06/2015 10:48   Dg Femur Min 2 Views Left  03/05/2015   CLINICAL DATA:  Fall.  Initial encounter.  EXAM: LEFT FEMUR 2 VIEWS  COMPARISON:  None.  FINDINGS: Laterally impacted subcapital left femoral neck fracture. No femoral head dislocation. No significant degenerative changes to the left acetabulum. No additional fracture.  IMPRESSION: Impacted subcapital left femoral neck fracture.   Electronically Signed   By: Monte Fantasia M.D.   On: 03/05/2015 12:28    Microbiology: Recent Results (from the past 240 hour(s))  Surgical pcr screen     Status: Abnormal   Collection Time: 03/05/15 10:47 PM  Result Value Ref Range Status    MRSA, PCR POSITIVE (A) NEGATIVE Final    Comment: RESULT CALLED TO, READ BACK BY AND VERIFIED WITH:  HANCOCK,C @ 0025 ON 03/06/15 BY WOODIE,J    Staphylococcus aureus POSITIVE (A) NEGATIVE Final    Comment:        The Xpert SA Assay (FDA approved for NASAL specimens in patients over 35 years of age), is one component of a comprehensive surveillance program.  Test performance has been validated by [facility] for patients greater than or equal to 59 year old. It is not intended to diagnose infection nor to guide or monitor treatment. RESULT CALLED TO, READ BACK BY AND VERIFIED WITH:  HANCOCK,C @ 0025 ON 03/06/15 BY WOODIE,J   Culture, Urine     Status: None (Preliminary result)   Collection Time: 03/05/15 10:49 PM  Result Value Ref Range Status   Specimen Description URINE, CLEAN CATCH  Final   Special Requests NONE  Final   Colony Count   Final    >=100,000 COLONIES/ML Performed at [facility]    Culture   Final    [place] Performed at [facility]    Report Status PENDING  Incomplete  Culture, blood (routine x 2)     Status: None (Preliminary result)   Collection Time: 03/05/15 10:50 PM  Result Value Ref Range Status   Specimen Description BLOOD LEFT ARM  Final   Special Requests BOTTLES DRAWN AEROBIC AND ANAEROBIC 8CC  Final   Culture NO GROWTH 2 DAYS  Final   Report Status PENDING  Incomplete  Culture, blood (routine x 2)     Status: None (Preliminary result)   Collection Time: 03/05/15 10:55 PM  Result Value Ref Range Status   Specimen Description BLOOD LEFT HAND  Final   Special Requests BOTTLES DRAWN AEROBIC AND ANAEROBIC 8CC  Final   Culture NO GROWTH 2 DAYS  Final   Report Status PENDING  Incomplete     Labs: Basic Metabolic Panel:  Recent Labs Lab 03/05/15 1330 03/06/15 0555 03/07/15 0541 03/08/15 0558  NA 139 139 136 139  K 4.2 4.0 3.7 3.7  CL 104 105 105 106  CO2 26 25 23 27   GLUCOSE 101* 114* 121* 101*  BUN 29* 23*  15 17  CREATININE 1.41* 1.09* 1.07* 1.10*  CALCIUM 9.2 8.7* 8.1* 8.4*   Liver Function Tests:  Recent Labs Lab 03/06/15 0555 03/07/15 0541 03/08/15 0558  AST 265* 81* 40  ALT 300* 111* 33  ALKPHOS 101 89 84  BILITOT 2.0* 0.8 0.9  PROT 6.5 5.9* 6.2*  ALBUMIN 3.3* 2.9* 3.0*   No results for input(s): LIPASE, AMYLASE in the last 168 hours. No results for input(s): AMMONIA in the last 168 hours. CBC:  Recent Labs Lab 03/05/15 1330 03/06/15 0555 03/07/15 0541 03/08/15 0558  WBC 12.4* 6.4 9.7 8.5  NEUTROABS 10.4*  --   --   --   HGB 11.5* 11.8* 9.7* 9.5*  HCT 35.1* 35.4* 29.3* 28.7*  MCV 89.8 88.5 88.0 88.0  PLT 229 204 168 173   Cardiac Enzymes: No results for input(s): CKTOTAL, CKMB, CKMBINDEX, TROPONINI in the last 168 hours. BNP: BNP (last 3 results) No results for input(s): BNP in the last 8760 hours.  ProBNP (last 3 results) No results for input(s): PROBNP in the last 8760 hours.  CBG:  Recent Labs Lab 03/05/15 1725  GLUCAP 179*       Signed:  Kevin Space  [facility] 03/08/2015, 9:33 AM

## 2015-03-08 NOTE — Progress Notes (Signed)
Attempt to call report x3

## 2015-03-09 ENCOUNTER — Other Ambulatory Visit: Payer: Self-pay | Admitting: *Deleted

## 2015-03-09 ENCOUNTER — Encounter (HOSPITAL_COMMUNITY): Payer: Self-pay | Admitting: Orthopedic Surgery

## 2015-03-09 ENCOUNTER — Non-Acute Institutional Stay (SKILLED_NURSING_FACILITY): Payer: Medicare Other | Admitting: Internal Medicine

## 2015-03-09 DIAGNOSIS — I482 Chronic atrial fibrillation, unspecified: Secondary | ICD-10-CM

## 2015-03-09 DIAGNOSIS — I10 Essential (primary) hypertension: Secondary | ICD-10-CM

## 2015-03-09 DIAGNOSIS — N183 Chronic kidney disease, stage 3 unspecified: Secondary | ICD-10-CM

## 2015-03-09 DIAGNOSIS — S72002D Fracture of unspecified part of neck of left femur, subsequent encounter for closed fracture with routine healing: Secondary | ICD-10-CM

## 2015-03-09 LAB — TYPE AND SCREEN
ABO/RH(D): B POS
ANTIBODY SCREEN: NEGATIVE
UNIT DIVISION: 0
Unit division: 0
Unit division: 0

## 2015-03-09 MED ORDER — HYDROCODONE-ACETAMINOPHEN 5-325 MG PO TABS: ORAL_TABLET | ORAL | Status: DC

## 2015-03-09 NOTE — Progress Notes (Signed)
Patient ID: Elizabeth Pierce, female   DOB: [birth year], 81 y.o.   MRN: [id number]   Elizabeth Pierce is an acute visit.  Level care skilled.  Facility [facility].  Chief complaint acute visit status post hospitalization for left ear fracture with repair.  History of present illness.  Patient is a pleasant 81 year old female who fell at home and sustained a left femoral neck fracture.  She tolerated the surgery well-she is on aspirin 325 a day for DVT prophylaxis with follow-up with orthopedics arranged.  She continues to be stable she has required minimal pain medications essentially Tylenol-apparently she did not do well with tramadol the hospital it caused increased confusion.  She did have some postop anemia is on iron supplements apparently did not require transfusion.  She also had elevated liver function tests which were new however these normalized before discharge.  She did have atrial fibrillation this was thought to be transient thought related to pain and fall it was thought she did not need aggressive anticoagulation at this time.  She also apparently had issues with hypotension in the hospital she had been on Norvasc-and Avapro-Norvasc has been [place] she is on Avapro blood pressure today is 106/53 she does not appear to be overtly symptomatic of any hypotension currently  She also apparently had complaints of dysuria question UTI I have reviewed the culture results it is Escherichia coli she has been discharged on Keflex and appears the Escherichia coli is sensitive to Cephalosporionss she does not really complain of dysuria today  She is here for rehabilitation she previously had lived alone-per discussion with family apparently there are discussions about what her post discharge living situation will be-apparently she had been fairly independent with help from family.  Previous medical history.  History of left femoral neck fracture status post repair.  Acute  blood loss  anemia.  Elevated liver function tests now resolved.  Atrial fibrillation thought to be transient.  Hypertension-hypotension again off Norvasc continues on Avapro.  Social history-no apparent smoking history partial tobacco history no significant alcohol or illicit drug use.  Family history nonpertinent.  Medications.  Aspirin 325 mg daily.  Avapro 300 mg daily.  Colace 100 mg twice a day.  Combigan eyedrops twice a day.  Dulcolax suppository 10 mg once a day when necessary.  Ferrous sulfate 325 mg twice a day.  Keflex 500 mg twice a day until June 18.  Multivitamin daily.  Norco 01/26/2024 milligrams one tablet every 6 hours when necessary pain family has requested this be discontinued.  Simethicone 80 mg every 6 hours when necessary.  Tylenol 650 mg every 6 hours when necessary.  Tylenol 650 mg twice a day at 9 AM and 9 PM routine.  WelChol daily.    Review of systems.  General does not complaining of any fever or chills.  Skin does not complain of rashes or itching has numerous seborrheic keratosis.  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  Respiratory no complaint shortness breath or cough.  Cardiac no chest pain or significant lower extremity edema.  GU is not complaining of dysuria.  GI does not complain of abdominal discomfort nausea vomiting diarrhea or constipation currently.  Musculoskeletal at this point hip pain appears to be controlled on Tylenol does not overtly complain of pain-family states she has a high pain tolerance.  Neurologic is not complaining of numbness headache  dizziness or syncopal-type feelings.  Psych she appears grossly alert and oriented does not complain of anxiety or depression appears to be in good spirits.  Physical exam.  She is afebrile pulse of 88 respirations 20 blood pressure 106/53.  In  general this is a pleasant alert elderly female who looks younger than her stated age she is not in any distress resting comfortably in bed.  Her skin is warm and dry she has numerous seborrheic keratosis prominently her neck face.  Eyes C again she does have an artificial right eye visual acuity right eye appears to be intact sclera and conjunctiva are clear.  Oropharynx is clear mucous membranes moist.  Chest is clear to auscultation there is no labored breathing.  Heart is regular rate and rhythm with some irregular beats pulse is in the high 80s she does not have significant lower extremity edema pedal pulses are intact she has compression hose on.  Abdomen is soft nontender positive bowel sounds.  GU could not really appreciate suprapubic tenderness she does not have any vaginal drainage or rash apparent.  Muscle skeletal strength upper extremities appears to be intact as well as movement of her right lower extre  left hip surgical site mity left lower extremities limited secondary to the recent hip surgery surgical site--- Staples are in place there is very minimal bruising or erythema really extending from the site no drainage this appears to be quite benign appearing.  Neurologic is grossly intact her speech is clear no lateralizing findings.  Psych she is alert and oriented pleasant and appropriate.  Labs.  03/08/2015.  Sodium 139 potassium 3.7 BUN 17 creatinine 1.10.  AST 40 ALT 30 3L: Phosphatase 84 bilirubin 0.9L [place] 3.0.  WBC 8.5 hemoglobin 9.5 platelets 173.  Assessment and plan.  #1-right hip fracture with repair-he appears to have tolerated this without any complications she did have some mild postop blood loss anemia she is now on iron as appears to have stabilized will check a CBC later this week. She is on aspirin 325 mg for DVT prophylaxis.  #2-postop anemia-as noted above she is on iron will update lab later this week.  #3 hypertension she continues on  Avapro Norvasc DC'd secondary to hypotension concerns-at this point will monitor.  #4-pain management-patient's pain appears to be controlled with the Tylenol per family, Ultram in the hospital made her quite confused-we will discontinue the Norco since apparently it is not neededfamily would like to minimize medications. We will list tramadol as an intolerance  #5-chronic kidney disease this appears to be stable will recheck that later this week as well discharge creatinine was 1.1 BUN 17 which actually apparently is quite stable for her.  #6 elevated liver function tests these appear to have normalize largely before discharge  #7 UTI -I have reviewed cultures from the hospital it has grown out greater than 100,000 colonies of Escherichia coli appears to be sensitive to the Cephalosporium's she is on Keflex finishing a course of this.  #8-atrial fibrillation-apparently this is intermittent-she does have some irregular beats on exam today but rate appears to be controlled at this point continue to monitor it appears this was discussed with the family upon hospital presentation-continues on aspirin for anticoagulation if rate increases suspect we may need a beta blocker--of note TSH in the hospital was low normal 0.589-1.068-- appears two TSHs were done    [id number]-of note greater than 35 minutes spent assessing patient-discussing her status with nursing staff as well as with family  at bedside--reviewing her chart--ande coordinating and formulating a plan of care for numerous diagnoses-of note greater than 50% of time spent coordinating plan of care

## 2015-03-09 NOTE — Telephone Encounter (Signed)
[facility] 

## 2015-03-10 ENCOUNTER — Non-Acute Institutional Stay (SKILLED_NURSING_FACILITY): Payer: Medicare Other | Admitting: Internal Medicine

## 2015-03-10 DIAGNOSIS — N183 Chronic kidney disease, stage 3 unspecified: Secondary | ICD-10-CM

## 2015-03-10 DIAGNOSIS — B9620 Unspecified Escherichia coli [E. coli] as the cause of diseases classified elsewhere: Secondary | ICD-10-CM | POA: Diagnosis not present

## 2015-03-10 DIAGNOSIS — S72002D Fracture of unspecified part of neck of left femur, subsequent encounter for closed fracture with routine healing: Secondary | ICD-10-CM | POA: Diagnosis not present

## 2015-03-10 DIAGNOSIS — N390 Urinary tract infection, site not specified: Secondary | ICD-10-CM

## 2015-03-10 LAB — CULTURE, BLOOD (ROUTINE X 2)
Culture: NO GROWTH
Culture: NO GROWTH

## 2015-03-13 NOTE — Progress Notes (Signed)
Patient ID: Elizabeth Pierce, female   DOB: 1919-02-28, 81 y.o.   MRN: [id number]                HISTORY & PHYSICAL  DATE:  03/10/2015                   FACILITY: [facility]                           LEVEL OF CARE:   SNF   CHIEF COMPLAINT:  Admission to SNF, post stay at [facility], 03/05/2015 through 03/08/2015.         HISTORY OF PRESENT ILLNESS:  This is a 81 year-old woman who lives in her own home with extensive in-home support from her family.   She apparently spilled some water on the floor and was attempting to clean it up when she slipped and fell, sustaining a left hip fracture.  She underwent operative repair with an ORIF.   She tolerated the surgery well.  I do not see any significant problems currently.    She did develop atrial fibrillation in the hospital.  Went back into sinus rhythm.   It was not felt that she needed anticoagulation for this.    She was hypotensive.  Her amlodipine and Losartan were held.  The Losartan was resumed, but not the amlodipine.    Finally, she had a UTI with a culture growing gram-negative rods.  She was discharged on Keflex.   The urine culture grew E.coli, sensitive to Keflex.  Her blood cultures were negative.    PAST MEDICAL HISTORY/PROBLEM LIST:                    Hypertension.    Acute renal failure.    Chronic renal failure stage III.    Atrial fibrillation.   Hyperlipidemia.    *Noteworthy that she does not have a history of osteoporosis, although no recent bone density.    PAST SURGICAL HISTORY:               Hip pinning, as described above.    CURRENT MEDICATIONS:  Medication list is reviewed.                 ASA 325 daily.    Dulcolax 10 q.d.       Ferrous sulfate 325 b.i.d.      Hydrocodone/acetaminophen 5/325 q.6 hours p.r.n.  (I think this has already been discontinued due to confusion with Ultram.)         Combigan ophthalmic.     Colace 250 b.i.d.       Avapro 300 q.d.        Dimethicone 80  q.6 hours p.r.n.        Travatan ophthalmic.    Welchol 3.75, 1 packet daily.    *Notable that her amlodipine is still on hold.     LABORATORY DATA:  Discharge lab work showed:    BUN 17, creatinine 1.1, potassium 3.7.    Liver function tests normal.    Notable that she had some degree of hypoalbuminemia, possibly suggesting protein calorie malnutrition.    Her discharge hemoglobin was 9.5.    SOCIAL HISTORY:                   HOUSING:  The patient lives in her own apartment.   FUNCTIONAL STATUS:  Has extensive support from family including bathing,  cooking, etc.  She does not do her outside activities.     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   CHEST/RESPIRATORY:  No cough.  No sputum.   CARDIAC:  No chest pain.    GI:  No abdominal pain.    PHYSICAL EXAMINATION:   VITAL SIGNS:     PULSE:  78 and regular.   RESPIRATIONS:  18 and unlabored.   02 SATURATIONS:  95% on room air.   GENERAL APPEARANCE:  The patient looks very well.   CHEST/RESPIRATORY:  Clear air entry bilaterally.     CARDIOVASCULAR:   CARDIAC:  Heart sounds are normal.  There are no murmurs.   GASTROINTESTINAL:   ABDOMEN:  Soft, nontender.     CIRCULATION:   EDEMA/VARICOSITIES:  Extremities:  No edema.  No evidence of a DVT.   MUSCULOSKELETAL:    EXTREMITIES:   LEFT LOWER EXTREMITY:  The left hip incision looks really remarkably well.     ASSESSMENT/PLAN:                         Status post left hip fracture in a 81 year-old woman.  She was an independent ambulator at home, but requiring a lot of assistance.  We will see how she does in physical therapy.    Chronic renal failure stage III.  Her discharge creatinine was 1.10.  I will recheck this next week.  Her discharge hemoglobin was 9.5.   This will also be rechecked.     Hypertension.  Back on her Losartan, but her amlodipine was discontinued.  This will need to be  followed.    Elevated LFTs.   She had an ALT of 300, an AST of 265, an albumin of 2.3, and a bilirubin of 2 on admission.  All of this had resolved by the time she was discharged.  The etiology of this was unclear, but it is noted that this was resolved.    UTI.  On Keflex, which should complete on 03/13/2015.     CPT CODE: 42683

## 2015-03-15 ENCOUNTER — Encounter (HOSPITAL_COMMUNITY)
Admission: RE | Admit: 2015-03-15 | Discharge: 2015-03-15 | Disposition: A | Discharge status: Home / Self Care | Payer: Medicare Other | Source: Skilled Nursing Facility | Attending: Internal Medicine | Admitting: Internal Medicine

## 2015-03-15 ENCOUNTER — Non-Acute Institutional Stay (SKILLED_NURSING_FACILITY): Payer: Medicare Other | Admitting: Internal Medicine

## 2015-03-15 DIAGNOSIS — N183 Chronic kidney disease, stage 3 unspecified: Secondary | ICD-10-CM

## 2015-03-15 DIAGNOSIS — I10 Essential (primary) hypertension: Secondary | ICD-10-CM | POA: Diagnosis not present

## 2015-03-15 LAB — CBC WITH DIFFERENTIAL/PLATELET
BASOS PCT: 1 % (ref 0–1)
Basophils Absolute: 0.1 10*3/uL (ref 0.0–0.1)
EOS ABS: 0.4 10*3/uL (ref 0.0–0.7)
Eosinophils Relative: 6 % — ABNORMAL HIGH (ref 0–5)
HCT: 28.1 % — ABNORMAL LOW (ref 36.0–46.0)
HEMOGLOBIN: 8.9 g/dL — AB (ref 12.0–15.0)
Lymphocytes Relative: 18 % (ref 12–46)
Lymphs Abs: 1.1 10*3/uL (ref 0.7–4.0)
MCH: 28.8 pg (ref 26.0–34.0)
MCHC: 31.7 g/dL (ref 30.0–36.0)
MCV: 90.9 fL (ref 78.0–100.0)
Monocytes Absolute: 0.8 10*3/uL (ref 0.1–1.0)
Monocytes Relative: 13 % — ABNORMAL HIGH (ref 3–12)
Neutro Abs: 3.7 10*3/uL (ref 1.7–7.7)
Neutrophils Relative %: 62 % (ref 43–77)
Platelets: 303 10*3/uL (ref 150–400)
RBC: 3.09 MIL/uL — ABNORMAL LOW (ref 3.87–5.11)
RDW: 14.2 % (ref 11.5–15.5)
WBC: 6.1 10*3/uL (ref 4.0–10.5)

## 2015-03-15 LAB — COMPREHENSIVE METABOLIC PANEL
ALK PHOS: 78 U/L (ref 38–126)
ALT: 16 U/L (ref 14–54)
AST: 20 U/L (ref 15–41)
Albumin: 3 g/dL — ABNORMAL LOW (ref 3.5–5.0)
Anion gap: 8 (ref 5–15)
BUN: 32 mg/dL — ABNORMAL HIGH (ref 6–20)
CHLORIDE: 108 mmol/L (ref 101–111)
CO2: 24 mmol/L (ref 22–32)
Calcium: 8.6 mg/dL — ABNORMAL LOW (ref 8.9–10.3)
Creatinine, Ser: 1.32 mg/dL — ABNORMAL HIGH (ref 0.44–1.00)
GFR calc Af Amer: 39 mL/min — ABNORMAL LOW (ref >60)
GFR calc non Af Amer: 34 mL/min — ABNORMAL LOW (ref >60)
GLUCOSE: 106 mg/dL — AB (ref 65–99)
POTASSIUM: 4.6 mmol/L (ref 3.5–5.1)
SODIUM: 140 mmol/L (ref 135–145)
TOTAL PROTEIN: 6.2 g/dL — AB (ref 6.5–8.1)
Total Bilirubin: 0.4 mg/dL (ref 0.3–1.2)

## 2015-03-16 ENCOUNTER — Non-Acute Institutional Stay (SKILLED_NURSING_FACILITY): Payer: Medicare Other | Admitting: Internal Medicine

## 2015-03-16 ENCOUNTER — Encounter: Payer: Self-pay | Admitting: Internal Medicine

## 2015-03-16 ENCOUNTER — Telehealth: Payer: Self-pay | Admitting: Orthopedic Surgery

## 2015-03-16 DIAGNOSIS — T148XXA Other injury of unspecified body region, initial encounter: Secondary | ICD-10-CM

## 2015-03-16 DIAGNOSIS — I9589 Other hypotension: Secondary | ICD-10-CM

## 2015-03-16 DIAGNOSIS — T148 Other injury of unspecified body region: Secondary | ICD-10-CM

## 2015-03-16 DIAGNOSIS — R400 Somnolence: Secondary | ICD-10-CM | POA: Diagnosis not present

## 2015-03-16 NOTE — Telephone Encounter (Signed)
Called back to [facility]; since Dr Aline Brochure is out of office at this time, nurse will call Dr Aline Brochure in the morning when he returns to office.

## 2015-03-16 NOTE — Telephone Encounter (Signed)
[place]

## 2015-03-16 NOTE — Progress Notes (Signed)
Patient ID: Elizabeth Pierce, female   DOB: 1919-05-01, 81 y.o.   MRN: [id number]     Elizabeth Pierce is an acute visit.  Level care skilled.  Facility [facility].  Chief complaint acute visit --- secondary to follow up left knee bruising-hypotension-question somnolent  History of present illness.  Patient is a pleasant 81 year old female who fell at home and sustained a left femoral neck fracture.  She tolerated the surgery well-she is on aspirin 325 a day for DVT prophylaxis with follow-up with orthopedics arranged.  She continues to be stable  Nursing staff has noted what appears to be a bruise posterior left knee-they have asked me to assess this.  I also note her blood pressures at times appear to run low systolically sometimes in the 80s this was an issue in the hospital her losartan and Norvasc were held Losartan was eventually restarted at 300 mg a day-Dr. Dellia Nims did assess this yesterday ans reduced her losartan to 150  mg a day.  Blood pressures appear to be somewhat improved today 123/53-125/57 most recently--.  She has not been symptomatic of any hypotension-no complaints of dizziness syncopal-type feelings.  Her family also thinks she somewhat more sleepy today but she was responsive right now alert when I saw her-she denies any shortness of breath dysuria she has any senna body for UTI she denies any dysuria-states she has a very good appetite.   She did have some postop anemia is on iron supplements apparently did not require transfusion--it appears her hemoglobin did drop to 8.9 her lab yesterday it had run in the mid nines--CBC is pending for next week for follow-up.  She also had elevated liver function tests which were new however these normalized before discharge.  She did have atrial fibrillation this was thought to be transient thought related to pain and fall it was thought she did not need aggressive anticoagulation at this time.--Is sounds like she is in sinus rhythm  today      Previous medical history.  History of left femoral neck fracture status post repair.  Acute  blood loss anemia.  Elevated liver function tests now resolved.  Atrial fibrillation thought to be transient.  Hypertension-hypotension again off Norvasc continues on Avapro.  Social history-no apparent smoking history partial tobacco history no significant alcohol or illicit drug use.  Family history nonpertinent.  Medications.  Aspirin 325 mg daily.  Avapro 150   mg daily.  Colace 100 mg twice a day.  Combigan eyedrops twice a day.  Dulcolax suppository 10 mg once a day when necessary.  Ferrous sulfate 325 mg twice a day. .  Multivitamin daily.  Norco 01/26/2024 milligrams one tablet every 6 hours when necessary pain family has requested this be discontinued.  Simethicone 80 mg every 6 hours when necessary.  Tylenol 650 mg every 6 hours when necessary.  Tylenol 650 mg twice a day at 9 AM and 9 PM routine.  WelChol daily.    Review of systems.  General does not complaining of any fever or chills.  Skin does not complain of rashes or itching has numerous seborrheic keratosis--nursing staff has noted some bruising posterior left knee.  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  Respiratory no complaint shortness breath or cough.  Cardiac no chest pain or significant lower extremity edema.--Apparently has some dependent edema at times  GU is not complaining  of dysuria.  GI does not complain of abdominal discomfort nausea vomiting diarrhea or constipation currently.  Musculoskeletal at this point hip pain appears to be controlled on Tylenol does not overtly complain of pain-family states she has a high pain tolerance.  Neurologic is not complaining of numbness headache dizziness or syncopal-type feelings--family feels she is sleeping  somewhat more today.  Psych she appears grossly alert and oriented does not complain of anxiety or depression appears to be in good spirits.  Physical exam.  Temperature 98.1 pulse 74 respirations 20 blood pressure 123/53  In general this is a pleasant alert elderly female who looks younger than her stated age she is not in any distress resting comfortably in bed.  Her skin is warm and dry she has numerous seborrheic keratosis prominently her neck face. 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  Oropharynx is clear mucous membranes moist.  Chest is clear to auscultation there is no labored breathing.  Heart is regular rate and rhythm with --she has rather mild looking ankle edema on the left she has a vigorous pedal pulse  .  Abdomen is soft nontender positive bowel sounds.  GU could not really appreciate suprapubic tenderness she does not have any vaginal drainage or rash apparent.  Muscle skeletal strength upper extremities appears to be intact as well as movement of her right lower extre  left hip surgical site mity left lower extremities limited secondary to the recent hip surgery surgical site--- surgical site per nursing looks benign-no sign of infection staples still in place  .  Neurologic is grossly intact her speech is clear no lateralizing findings.  Psych she is alert and oriented pleasant and appropriate.  Labs.  03/15/2015.  WBC 6.1 hemoglobin 8.9 platelets 303.  Sodium 140 potassium 4.6 BUN 32 creatinine 1.32-liver function tests within normal limits except albumin of 3.    03/08/2015.  Sodium 139 potassium 3.7 BUN 17 creatinine 1.10.  AST 40 ALT 30 3L: Phosphatase 84 bilirubin 0.9L  Bierman 3.0.  WBC 8.5 hemoglobin 9.5 platelets 173.  Assessment and plan.  #1-right hip fracture with repair-he appears to have tolerated this without any complications she did have some mild postop blood loss anemia--hemoglobin has dipped a bit to 8.9-update CBC is pending for next week and I on this clinically she appears stable-she is on aspirin 325 mg today for anticoagulation .  #2-postop anemia-as noted above she is on iron .  #3 hypertension she continues on Avapro Norvasc DC'd secondary to hypotension concerns-her Avapro was reduced yesterday secondary to hypotension concerns-blood pressure today appears to be stable with systolic in the 258N.  #4-pain management-patient's pain appears to be controlled with the Tylenol per family, Ultram in the hospital made her quite confused- e  #5-chronic kidney disease  Most recent creatinine 1.32 BUN 32 yesterday-this will have to be monitored update has been ordered for next week  #6 elevated liver function tests these appear to have normalize largely before discharge  #7 UTI - Currently asymptomatic she has completed her Keflex  #8-atrial fibrillation-apparently this is intermittent- Heart rate appears to be controlled today-it is regular  #9-bruise left posterior knee-at this point appears to be  fairly benign--this does not appear  to be systemic issue at this point continue to monitor for changes.      [id number] l

## 2015-03-16 NOTE — Telephone Encounter (Signed)
Routing back to [employer]

## 2015-03-16 NOTE — Telephone Encounter (Signed)
Routing to Dr Harrison 

## 2015-03-16 NOTE — Telephone Encounter (Signed)
Patient's daughter/power of attorney, Patrecia Pace, [phone], called regarding the appointment scheduled for her mom 04/08/15 per [facility] for hospital follow-up status/post hip fracture surgery 03/06/15 (cannulated pinning).  States she is concerned about some bruising to the area, and is aware that staples are to be removed soon, but is asking if Dr Aline Brochure can see her mom before the appointment - okay to schedule wound check in office (states"mom is 81 years old), or other advice?

## 2015-03-17 NOTE — Telephone Encounter (Signed)
Dr Aline Brochure spoke with [place] this morning at approximately 9:35a.m.  Called back to patient's daughter to relay.

## 2015-03-20 NOTE — Progress Notes (Addendum)
Patient ID: Elizabeth Pierce, female   DOB: 1919-09-11, 81 y.o.   MRN: [id number]                PROGRESS NOTE  DATE:  03/15/2015            FACILITY: [place]                      LEVEL OF CARE:   SNF   Acute Visit                   CHIEF COMPLAINT:  Follow up renal issues.       HISTORY OF PRESENT ILLNESS:  This is a 81 year-old woman who lives in her home with extensive in-home support from her family.  She fell, fracturing her left hip and underwent an operative repair.    She has a history of hypertension and she was actually hypotensive in the hospital.  Her Losartan and amlodipine were put on hold.  Her creatinine went up to 1.41 on 03/05/2015.  Her Losartan was restarted at discharge.  Her creatinine today is up to 1.32, her BUN is 32, both higher than when she left the hospital a week ago at 17 and 1.0, respectively.    Also, her hemoglobin has dropped from 11.8 on 03/06/2015 to 8.9 today.    CURRENT MEDICATIONS:  Medication list is reviewed.                Enteric-coated aspirin 325 q.d.      Ferrous sulfate b.i.d.        Combigan 1 drop into the left eye b.i.d.       Colace 250 q.d.       Avapro 300 mg daily.    Travatan 1 drop into the left eye in the evening.    Welchol 3.75 daily.       PHYSICAL EXAMINATION:   VITAL SIGNS:     BLOOD PRESSURE:  Running in the 947-654 systolic.   GENERAL APPEARANCE:  The patient is not in any distress.     CHEST/RESPIRATORY:  Clear air entry bilaterally.    CARDIOVASCULAR:   CARDIAC:  The patient appears to be euvolemic.  She appears well.    EDEMA/VARICOSITIES:  Extremities:  No edema.      ASSESSMENT/PLAN:                Hypertension.  This seems to be almost too well controlled.  I am going to drop the Avapro to 150 mg a day.  Follow up on her BUN and creatinine in one week.    Worsening anemia.  I am assuming this is blood loss.  I will follow up on a CBC in one week.   She is already on iron.

## 2015-03-22 LAB — CBC WITH DIFFERENTIAL/PLATELET
BASOS ABS: 0.1 10*3/uL (ref 0.0–0.1)
BASOS PCT: 2 % — AB (ref 0–1)
EOS ABS: 0.4 10*3/uL (ref 0.0–0.7)
Eosinophils Relative: 8 % — ABNORMAL HIGH (ref 0–5)
HEMATOCRIT: 32.6 % — AB (ref 36.0–46.0)
Hemoglobin: 10.3 g/dL — ABNORMAL LOW (ref 12.0–15.0)
Lymphocytes Relative: 25 % (ref 12–46)
Lymphs Abs: 1.4 10*3/uL (ref 0.7–4.0)
MCH: 28.9 pg (ref 26.0–34.0)
MCHC: 31.6 g/dL (ref 30.0–36.0)
MCV: 91.3 fL (ref 78.0–100.0)
MONOS PCT: 11 % (ref 3–12)
Monocytes Absolute: 0.6 10*3/uL (ref 0.1–1.0)
Neutro Abs: 3 10*3/uL (ref 1.7–7.7)
Neutrophils Relative %: 54 % (ref 43–77)
Platelets: 463 10*3/uL — ABNORMAL HIGH (ref 150–400)
RBC: 3.57 MIL/uL — ABNORMAL LOW (ref 3.87–5.11)
RDW: 14.3 % (ref 11.5–15.5)
WBC: 5.5 10*3/uL (ref 4.0–10.5)

## 2015-03-22 LAB — BASIC METABOLIC PANEL
Anion gap: 9 (ref 5–15)
BUN: 26 mg/dL — ABNORMAL HIGH (ref 6–20)
CHLORIDE: 105 mmol/L (ref 101–111)
CO2: 26 mmol/L (ref 22–32)
Calcium: 8.9 mg/dL (ref 8.9–10.3)
Creatinine, Ser: 1.24 mg/dL — ABNORMAL HIGH (ref 0.44–1.00)
GFR calc Af Amer: 42 mL/min — ABNORMAL LOW (ref >60)
GFR calc non Af Amer: 36 mL/min — ABNORMAL LOW (ref >60)
GLUCOSE: 113 mg/dL — AB (ref 65–99)
POTASSIUM: 4.4 mmol/L (ref 3.5–5.1)
SODIUM: 140 mmol/L (ref 135–145)

## 2015-03-30 ENCOUNTER — Encounter: Payer: Self-pay | Admitting: Internal Medicine

## 2015-03-30 ENCOUNTER — Non-Acute Institutional Stay (SKILLED_NURSING_FACILITY): Payer: Medicare Other | Admitting: Internal Medicine

## 2015-03-30 ENCOUNTER — Encounter (HOSPITAL_COMMUNITY)
Admission: AD | Admit: 2015-03-30 | Discharge: 2015-03-30 | Disposition: A | Discharge status: Home / Self Care | Payer: Medicare Other | Source: Skilled Nursing Facility | Attending: Internal Medicine | Admitting: Internal Medicine

## 2015-03-30 DIAGNOSIS — I4891 Unspecified atrial fibrillation: Secondary | ICD-10-CM

## 2015-03-30 DIAGNOSIS — D649 Anemia, unspecified: Secondary | ICD-10-CM | POA: Diagnosis not present

## 2015-03-30 DIAGNOSIS — I10 Essential (primary) hypertension: Secondary | ICD-10-CM

## 2015-03-30 DIAGNOSIS — N183 Chronic kidney disease, stage 3 unspecified: Secondary | ICD-10-CM

## 2015-03-30 LAB — CBC
HEMATOCRIT: 28 % — AB (ref 36.0–46.0)
Hemoglobin: 9.1 g/dL — ABNORMAL LOW (ref 12.0–15.0)
MCH: 29.6 pg (ref 26.0–34.0)
MCHC: 32.5 g/dL (ref 30.0–36.0)
MCV: 91.2 fL (ref 78.0–100.0)
Platelets: 270 10*3/uL (ref 150–400)
RBC: 3.07 MIL/uL — AB (ref 3.87–5.11)
RDW: 14.1 % (ref 11.5–15.5)
WBC: 5.7 10*3/uL (ref 4.0–10.5)

## 2015-03-30 LAB — BASIC METABOLIC PANEL
ANION GAP: 7 (ref 5–15)
BUN: 35 mg/dL — ABNORMAL HIGH (ref 6–20)
CALCIUM: 8.8 mg/dL — AB (ref 8.9–10.3)
CO2: 26 mmol/L (ref 22–32)
Chloride: 108 mmol/L (ref 101–111)
Creatinine, Ser: 1.61 mg/dL — ABNORMAL HIGH (ref 0.44–1.00)
GFR calc Af Amer: 31 mL/min — ABNORMAL LOW (ref >60)
GFR, EST NON AFRICAN AMERICAN: 26 mL/min — AB (ref >60)
Glucose, Bld: 93 mg/dL (ref 65–99)
Potassium: 5 mmol/L (ref 3.5–5.1)
SODIUM: 141 mmol/L (ref 135–145)

## 2015-03-30 NOTE — Progress Notes (Signed)
Patient ID: Elizabeth Pierce, female   DOB: [birth year], 81 y.o.   MRN: [id number]      Elizabeth Pierce is an acute visit.  Level care skilled.  Facility [facility].  Chief complaint acute visit --- follow-up anemia-renal insufficiency  History of present illness.  Patient is a pleasant 81 year old female who fell at home and sustained a left femoral neck fracture.  She tolerated the surgery well-she is on aspirin 325 a day for DVT prophylaxis with follow-up with orthopedics arranged.  She continues to be stable  She does have a history of chronic renal insufficiency-most recent creatinine today is 1.61 BUN of 35 this appears to be gradually rising on June 27 it was 1.24 with a BUN of 26-on June 20 was 1.32-she is not on any diuretic or ACE inhibitor--per nursing she is eating and drinking quite well    She did have some postop anemia is on iron supplements apparently did not require transfusion--it appears her hemoglobin did drop to 8.9 on lab done on June 20 rebound it up to 10.3 on June 27 however on lab today it is back down at 9.1 this is a normocytic anemia with an MCV of 91.2-she is on iron  She also had elevated liver function tests which were new however these normalized before discharge.  She did have atrial fibrillation this was thought to be transient thought related to pain and fall it was thought she did not need aggressive anticoagulation at this time.--Is sounds like she is in sinus rhythm today      Previous medical history.  History of left femoral neck fracture status post repair.  Acute  blood loss anemia.  Elevated liver function tests now resolved.  Atrial fibrillation thought to be transient.  Hypertension-hypotension again off Norvasc continues on Avapro.  Social history-no apparent smoking history partial tobacco history no significant alcohol or illicit drug use.  Family history nonpertinent.  Medications.  Aspirin 325 mg daily.  Avapro 150   mg  daily.  Colace 100 mg twice a day.  Combigan eyedrops twice a day.  Dulcolax suppository 10 mg once a day when necessary.  Ferrous sulfate 325 mg twice a day. .  Multivitamin daily.  Norco 01/26/2024 milligrams one tablet every 6 hours when necessary pain family has requested this be discontinued.  Simethicone 80 mg every 6 hours when necessary.  Tylenol 650 mg every 6 hours when necessary.  Tylenol 650 mg twice a day at 9 AM and 9 PM routine.  WelChol daily.    Review of systems.  General does not complaining of any fever or chills.  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  Respiratory no complaint shortness breath or cough.  Cardiac no chest pain or significant lower extremity edema.--Apparently has some dependent edema at times this appears fairly mild today she has compression hose on  GU is not complaining of dysuria.  GI does not complain of abdominal discomfort nausea vomiting diarrhea or constipation currently.  Musculoskeletal at this point hip pain appears to be controlled on Tylenol does not overtly complain of pain-family states she has a high pain tolerance.  Neurologic is not complaining of numbness headache dizziness or syncopal-type feelings-.  Psych she appears grossly alert and oriented does not complain of anxiety or depression appears to  be in good spirits.  Physical exam.   temperature 98.1 pulse 84 respirations 18 blood pressure 138/76  In general this is a pleasant alert elderly female who looks younger than her stated age she is not in any distress   Her skin is warm and dry she has numerous seborrheic keratosis prominently her neck face.    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  Oropharynx is clear mucous membranes moist.  Chest is clear to auscultation there is no labored breathing.  Heart is regular rate and rhythm with --she has trace lower extremity edema compression hose are applied  .  Abdomen is soft nontender positive bowel sounds.   Rectal-I did test for occult blood this appeared to be negative.  Muscle skeletal --is able to move all extremities 4 is able to stand is now able to stand without assistance   .  Neurologic is grossly intact her speech is clear no lateralizing findings.  Psych she is alert and oriented pleasant and appropriate.  Labs.  03/30/2015.  Sodium 141 potassium 5 BUN 35 creatinine 1.61.  WBC 5.7 hemoglobin 9.1 platelets 270.  03/22/2015.  Sodium 140 potassium 4.4 BUN 26 creatinine 1.24.  WBC 5.5 hemoglobin 10.3 platelets 463  03/15/2015.  WBC 6.1 hemoglobin 8.9 platelets 303.  Sodium 140 potassium 4.6 BUN 32 creatinine 1.32-liver function tests within normal limits except albumin of 3.    03/08/2015.  Sodium 139 potassium 3.7 BUN 17 creatinine 1.10.  AST 40 ALT 30 3L: Phosphatase 84 bilirubin 0.9L Bierman 3.0.  WBC 8.5 hemoglobin 9.5 platelets 173.  Assessment and plan.  #1-history of anemia-this was thought to be postop she is been started on iron-hemoglobin has dropped a  however most recent lab from 10.3 down to 9.1-occult blood testing done today was negative-will continue to check this 3-there may be an element of renal insufficiency here as well-will update CBC -later this week as well  I have searched her records in the Epic system I do not really see any significant history of anemia rectal bleeding or GI history-patient denies this as well  .  #2 hypertension she continues on Avapro Norvasc DC'd secondary to hypotension concerns-her Avapro was reduced secondary to hypotension concerns-blood pressure today appears to be stable most recently 138/76-123/53-lowest one  I see is 92/50-highest one II see is 158/66-although neither of these appear to be,common.   e  #3-chronic kidney disease   Her creatinine appears to be slowly rising at 1.61 today with BUN of 35 appears previous baseline had been in the low ones-apparently she is eating and drinking well per patient and nursing staff-I do not see any offending medicines currently-at this point will monitor and check a basic metabolic panel later this week-this was discussed with Dr. Dellia Nims via phone  #4-atrial fibrillation-apparently this is intermittent- Heart rate appears to be controlled today-it is regular  #5 history of elevated liver function tests-this as largely normalize per review of labs on June 20 in 03/22/2015--albumin -3.0 otherwise liver function tests appear unremarkable         [id number]--of note greater than 35 minutes spent assessing patient-discussing her status with nursing staff-reviewing her chart-and coordinating and formulating a plan of care for numerous diagnoses-of note greater than 50% of time spent coordinating plan of care l

## 2015-04-02 ENCOUNTER — Encounter (HOSPITAL_COMMUNITY)
Admission: AD | Admit: 2015-04-02 | Discharge: 2015-04-02 | Disposition: A | Discharge status: Home / Self Care | Payer: Medicare Other | Source: Skilled Nursing Facility | Attending: Internal Medicine | Admitting: Internal Medicine

## 2015-04-02 LAB — BASIC METABOLIC PANEL
Anion gap: 6 (ref 5–15)
BUN: 22 mg/dL — AB (ref 6–20)
CALCIUM: 8.8 mg/dL — AB (ref 8.9–10.3)
CO2: 25 mmol/L (ref 22–32)
CREATININE: 1.38 mg/dL — AB (ref 0.44–1.00)
Chloride: 109 mmol/L (ref 101–111)
GFR, EST AFRICAN AMERICAN: 37 mL/min — AB (ref >60)
GFR, EST NON AFRICAN AMERICAN: 32 mL/min — AB (ref >60)
GLUCOSE: 101 mg/dL — AB (ref 65–99)
Potassium: 4.8 mmol/L (ref 3.5–5.1)
Sodium: 140 mmol/L (ref 135–145)

## 2015-04-02 LAB — CBC
HEMATOCRIT: 27.8 % — AB (ref 36.0–46.0)
HEMOGLOBIN: 9 g/dL — AB (ref 12.0–15.0)
MCH: 29.3 pg (ref 26.0–34.0)
MCHC: 32.4 g/dL (ref 30.0–36.0)
MCV: 90.6 fL (ref 78.0–100.0)
Platelets: 219 10*3/uL (ref 150–400)
RBC: 3.07 MIL/uL — ABNORMAL LOW (ref 3.87–5.11)
RDW: 14 % (ref 11.5–15.5)
WBC: 4.3 10*3/uL (ref 4.0–10.5)

## 2015-04-03 LAB — OCCULT BLOOD X 1 CARD TO LAB, STOOL: Fecal Occult Bld: NEGATIVE

## 2015-04-06 LAB — OCCULT BLOOD X 1 CARD TO LAB, STOOL: Fecal Occult Bld: NEGATIVE

## 2015-04-08 ENCOUNTER — Ambulatory Visit (HOSPITAL_COMMUNITY): Payer: No Typology Code available for payment source | Attending: Orthopedic Surgery

## 2015-04-08 ENCOUNTER — Ambulatory Visit (INDEPENDENT_AMBULATORY_CARE_PROVIDER_SITE_OTHER): Payer: Medicare Other | Admitting: Orthopedic Surgery

## 2015-04-08 ENCOUNTER — Encounter: Payer: Self-pay | Admitting: Orthopedic Surgery

## 2015-04-08 VITALS — BP 146/62 | Ht 61.0 in | Wt 114.0 lb

## 2015-04-08 DIAGNOSIS — Z4789 Encounter for other orthopedic aftercare: Secondary | ICD-10-CM

## 2015-04-08 DIAGNOSIS — S72002D Fracture of unspecified part of neck of left femur, subsequent encounter for closed fracture with routine healing: Secondary | ICD-10-CM

## 2015-04-08 DIAGNOSIS — Z4889 Encounter for other specified surgical aftercare: Secondary | ICD-10-CM | POA: Diagnosis not present

## 2015-04-08 DIAGNOSIS — S72012D Unspecified intracapsular fracture of left femur, subsequent encounter for closed fracture with routine healing: Secondary | ICD-10-CM | POA: Insufficient documentation

## 2015-04-08 DIAGNOSIS — W19XXXD Unspecified fall, subsequent encounter: Secondary | ICD-10-CM | POA: Insufficient documentation

## 2015-04-08 LAB — OCCULT BLOOD X 1 CARD TO LAB, STOOL: Fecal Occult Bld: NEGATIVE

## 2015-04-08 NOTE — Progress Notes (Signed)
FRACTURE CARE   Patient ID: Elizabeth Pierce, female   DOB: April 05, 1919, 81 y.o.   MRN: [id number]  Chief Complaint  Patient presents with  . Follow-up    Hospital follow up on left hip, DOS 03-06-15.    DX left femoral neck fracture Encounter Diagnoses  Name Primary?  . Surgical aftercare, musculoskeletal system Yes  . Fracture of femoral neck, left, closed, with routine healing, subsequent encounter      TREATMENT internal fixation with 3 cannulated screws  PAIN MEDS: Tylenol  WEIGHT BEARING STATUS as tolerated  XRAYS I reviewed the hospital film fixation is intact without complication the fracture is progressing towards healing  EXAM excellent range of motion of the hip without pain  BP 146/62 mmHg  Ht 5\' 1"  (1.549 m)  Wt 114 lb (51.71 kg)  BMI 21.55 kg/m2  Continue physical therapy return 8 weeks for x-ray AP lateral left hip    ASSESSMENT AND PLAN   As above

## 2015-04-09 LAB — CBC
HEMATOCRIT: 30.7 % — AB (ref 36.0–46.0)
Hemoglobin: 9.9 g/dL — ABNORMAL LOW (ref 12.0–15.0)
MCH: 29.2 pg (ref 26.0–34.0)
MCHC: 32.2 g/dL (ref 30.0–36.0)
MCV: 90.6 fL (ref 78.0–100.0)
Platelets: 210 10*3/uL (ref 150–400)
RBC: 3.39 MIL/uL — AB (ref 3.87–5.11)
RDW: 13.7 % (ref 11.5–15.5)
WBC: 5.4 10*3/uL (ref 4.0–10.5)

## 2015-04-09 LAB — BASIC METABOLIC PANEL
Anion gap: 7 (ref 5–15)
BUN: 21 mg/dL — AB (ref 6–20)
CO2: 26 mmol/L (ref 22–32)
CREATININE: 1.29 mg/dL — AB (ref 0.44–1.00)
Calcium: 9.1 mg/dL (ref 8.9–10.3)
Chloride: 108 mmol/L (ref 101–111)
GFR calc Af Amer: 40 mL/min — ABNORMAL LOW (ref >60)
GFR calc non Af Amer: 35 mL/min — ABNORMAL LOW (ref >60)
GLUCOSE: 95 mg/dL (ref 65–99)
Potassium: 3.7 mmol/L (ref 3.5–5.1)
SODIUM: 141 mmol/L (ref 135–145)

## 2015-04-20 ENCOUNTER — Non-Acute Institutional Stay (SKILLED_NURSING_FACILITY): Payer: Medicare Other | Admitting: Internal Medicine

## 2015-04-20 DIAGNOSIS — N183 Chronic kidney disease, stage 3 unspecified: Secondary | ICD-10-CM

## 2015-04-20 DIAGNOSIS — I10 Essential (primary) hypertension: Secondary | ICD-10-CM | POA: Diagnosis not present

## 2015-04-20 DIAGNOSIS — S72002D Fracture of unspecified part of neck of left femur, subsequent encounter for closed fracture with routine healing: Secondary | ICD-10-CM | POA: Diagnosis not present

## 2015-04-20 DIAGNOSIS — I482 Chronic atrial fibrillation, unspecified: Secondary | ICD-10-CM

## 2015-04-20 NOTE — Progress Notes (Signed)
Patient ID: Elizabeth Pierce, female   DOB: 1918/12/03, 81 y.o.   MRN: [id number]       Elizabeth Pierce is a discharge note   Level care skilled.  Facility [facility].  Chief complaint--discharge note  History of present illness.  Patient is a pleasant 81 year old female who fell at home and sustained a left femoral neck fracture.  She tolerated the surgery well-she is on aspirin 325 a day for DVT prophylaxis and is followed by orthopedic she has gained strength is able to stand and use her walker  She continues to be stable  She does have a history of chronic renal insufficiency-creatinine on July 15 was 1.9 BUN of 21 this actually is an improvement for her with baseline creatinine being somewhat higher in the mid ones recently-.  She also had a drop in her hemoglobin down to 9.1 but this has rebounded up to 9.9 she is on iron.  Patient does have variable blood pressures I see recent systolics in the 626R and 150s however she also has readings in the low 100s at times and this appears to be more orthostatic related apparently-on exam today her systolic was in the 485I sitting-- standing it was in the 140s         She also had elevated liver function tests which were new however these normalized before discharge.  She did have atrial fibrillation this was thought to be transient thought related to pain and fall it was thought she did not need aggressive anticoagulation at this time.--Is sounds like she is in sinus rhythm today  She has a very supportive family who is actually in the room with her today she will need a rolling walker and bedside commode secondary to fall risk weakness status post hip fracture repair-also will need continued PT and OT for strengthening as noted above      Previous medical history.  History of left femoral neck fracture status post repair.  Acute  blood loss anemia.  Elevated liver function tests now resolved.  Atrial fibrillation thought to be  transient.  Hypertension-hypotension again off Norvasc continues on Avapro.  Social history-no apparent smoking history partial tobacco history no significant alcohol or illicit drug use.  Family history nonpertinent.  Medications.  Aspirin 325 mg daily.  Avapro 150   mg daily.  Colace 100 mg twice a day.  Combigan eyedrops twice a day.  Dulcolax suppository 10 mg once a day when necessary.  Ferrous sulfate 325 mg twice a day. .  Multivitamin daily.  Norco 01/26/2024 milligrams one tablet every 6 hours when necessary pain family has requested this be discontinued.  Simethicone 80 mg every 6 hours when necessary.  Tylenol 650 mg every 6 hours when necessary.  Tylenol 650 mg twice a day at 9 AM and 9 PM routine.  WelChol daily.    Review of systems.  General does not complaining of any fever or chills.  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  Respiratory no complaint shortness breath or cough.  Cardiac no chest pain or significant lower extremity edema.--Apparently has some dependent edema at times this appears fairly mild today  GU is not complaining of dysuria.  GI does not complain of abdominal discomfort nausea vomiting diarrhea or constipation currently.  Musculoskeletal at this point  hip pain appears to be controlled on Tylenol does not overtly complain of pain-family states she has a high pain tolerance.  Neurologic is not complaining of numbness headache dizziness or syncopal-type feelings-.  Psych she appears grossly alert and oriented does not complain of anxiety or depression appears to be in good spirits.  Physical exam.   She is afebrile pulse is 68 respirations 20 blood pressure as noted above   In general this is a pleasant alert elderly female  who looks younger than her stated age she is not in any distress   Her skin is warm and dry she has numerous seborrheic keratosis prominently her neck face. 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  Oropharynx is clear mucous membranes moist.  Chest is clear to auscultation there is no labored breathing.  Heart is regular rate and rhythm with --she has trace lower extremity edema compression hose are applied  .  Abdomen is soft nontender positive bowel sounds.    Muscle skeletal --is able to move all extremities 4 is able to stand is now able to stand without assistance and ambulate with a walker although she still has weakness   .  Neurologic is grossly intact her speech is clear no lateralizing findings.  Psych she is alert and oriented pleasant and appropriate.  Labs.  04/09/2015.  Sodium 141 potassium 3.7 BUN 21 creatinine 1.29.  WBC 5.4 hemoglobin 9.9 platelets 210.  Occult blood testing has been negative  03/30/2015.  Sodium 141 potassium 5 BUN 35 creatinine 1.61.  WBC 5.7 hemoglobin 9.1 platelets 270.  03/22/2015.  Sodium 140 potassium 4.4 BUN 26 creatinine 1.24.  WBC 5.5 hemoglobin 10.3 platelets 463  03/15/2015.  WBC 6.1 hemoglobin 8.9 platelets 303.  Sodium 140 potassium 4.6 BUN 32 creatinine 1.32-liver function tests within normal limits except albumin of 3.    03/08/2015.  Sodium 139 potassium 3.7 BUN 17 creatinine 1.10.  AST 40 ALT 30 3L: Phosphatase 84 bilirubin 0.9L Bierman 3.0.  WBC 8.5 hemoglobin 9.5 platelets 173.  Assessment and plan.  #1-history of anemia-this was thought to be postop she is been started on iron  initially 28.9 after surgery-then Rowson dropped to 9.1 however most recently 9.9 will recheck this clinically she appears to be stablehemoglobin dropped  I have searched her  records in the Epic system I do not really see any significant history of anemia rectal bleeding or GI history-patient denies this as well  .  #2 hypertension she continues on Avapro Norvasc DC'd secondary to hypotension concerns-her Avapro was reduced secondary to hypotension concerns Blood pressures as noted above appear to be variable will defer follow-up to primary care provider family is very aware of this and actually keeps a log of her blood pressures which is reassuring.  #3 history of hip fracture with repair as noted above she is followed by orthopedics this appears to be stable pain appears to be well controlled on Tylenol as needed.  Marland Kitchen   e  #4-chronic kidney disease  Is actually appears improved with a BUN of 21 creatinine 1.29 on July 15 will update this as well    #5-atrial fibrillation-apparently this is intermittent- Heart rate appears to be controlled today-it is regular  #6 history of elevated liver function tests-this as largely normalize per review of labs on June 20 in 03/22/2015--albumin -3.0  otherwise liver function tests appear unremarkable   [id number]-of note greater than 30 minutes spent on this discharge summary including  discussion with family at [place] her chart-and coordinating formulating a plan of care for numerous diagnoses-of note greater than 50% of time spent coordinating plan of care with family input   l

## 2015-04-21 ENCOUNTER — Encounter: Payer: Self-pay | Admitting: Internal Medicine

## 2015-04-21 ENCOUNTER — Non-Acute Institutional Stay (SKILLED_NURSING_FACILITY): Payer: Medicare Other | Admitting: Internal Medicine

## 2015-04-21 DIAGNOSIS — I482 Chronic atrial fibrillation, unspecified: Secondary | ICD-10-CM

## 2015-04-21 DIAGNOSIS — I10 Essential (primary) hypertension: Secondary | ICD-10-CM

## 2015-04-21 DIAGNOSIS — N183 Chronic kidney disease, stage 3 unspecified: Secondary | ICD-10-CM

## 2015-04-21 DIAGNOSIS — S72002D Fracture of unspecified part of neck of left femur, subsequent encounter for closed fracture with routine healing: Secondary | ICD-10-CM | POA: Diagnosis not present

## 2015-04-21 LAB — COMPREHENSIVE METABOLIC PANEL
ALBUMIN: 3.2 g/dL — AB (ref 3.5–5.0)
ALT: 14 U/L (ref 14–54)
ANION GAP: 8 (ref 5–15)
AST: 19 U/L (ref 15–41)
Alkaline Phosphatase: 77 U/L (ref 38–126)
BUN: 35 mg/dL — ABNORMAL HIGH (ref 6–20)
CALCIUM: 8.5 mg/dL — AB (ref 8.9–10.3)
CO2: 26 mmol/L (ref 22–32)
Chloride: 104 mmol/L (ref 101–111)
Creatinine, Ser: 1.92 mg/dL — ABNORMAL HIGH (ref 0.44–1.00)
GFR calc Af Amer: 25 mL/min — ABNORMAL LOW (ref >60)
GFR calc non Af Amer: 21 mL/min — ABNORMAL LOW (ref >60)
GLUCOSE: 103 mg/dL — AB (ref 65–99)
Potassium: 4.4 mmol/L (ref 3.5–5.1)
SODIUM: 138 mmol/L (ref 135–145)
TOTAL PROTEIN: 6.1 g/dL — AB (ref 6.5–8.1)
Total Bilirubin: 0.4 mg/dL (ref 0.3–1.2)

## 2015-04-21 LAB — CBC
HEMATOCRIT: 28.5 % — AB (ref 36.0–46.0)
Hemoglobin: 9.3 g/dL — ABNORMAL LOW (ref 12.0–15.0)
MCH: 29.1 pg (ref 26.0–34.0)
MCHC: 32.6 g/dL (ref 30.0–36.0)
MCV: 89.1 fL (ref 78.0–100.0)
PLATELETS: 248 10*3/uL (ref 150–400)
RBC: 3.2 MIL/uL — AB (ref 3.87–5.11)
RDW: 13.4 % (ref 11.5–15.5)
WBC: 5.5 10*3/uL (ref 4.0–10.5)

## 2015-04-21 NOTE — Progress Notes (Signed)
Patient ID: Elizabeth Pierce, female   DOB: 06-19-1919, 81 y.o.   MRN: [id number]       Elizabeth Pierce is  Discharge note  Level care skilled.  Facility [facility].  Chief complaint -discharge note  History of present illness.  Patient is a pleasant 81 year old female who fell at home and sustained a left femoral neck fracture.  She tolerated the surgery well-she is on aspirin 325 a day for DVT prophylaxis with follow-up with orthopedics arranged.  She continues to be stable  She does have a history of chronic renal insufficiency--creatinine did rise up to 1.61 about 3 weeks ago however recent lab on July 15 it was down to 1.29--however on lab done today creatinine is up to 1.92 with a BUN of 35-she is eating and drinking well per nursing staff and family-she is not on a diuretic of ACE inhibitor. however she is on an ARB Avapro-I discussed this with Dr. Dellia Nims and we will discontinue this and monitor.  She has variable blood pressures I took it manually today and got 152/70 sitting and 142/70 standing-however family and staff at times appears have noted somewhat low blood pressures with systolics in the high 75I-.       She did have some postop anemia is on iron supplements apparently did not require transfusion--it appears her hemoglobin did drop to 8.9 on lab done on June 20 rebound it up to 10.3 on June 27 however on lab today it is back down at 9.1 this is a normocytic anemia with an MCV of 91.2-she is on iron  She also had elevated liver function tests which were new however these normalized before discharge.  She did have atrial fibrillation this was thought to be transient thought related to pain and fall it was thought she did not need aggressive anticoagulation at this time.--Is sounds like she is in sinus rhythm today  Patient will be going home she does have very supportive family who keeps an eye on her closely-they will be monitoring her blood pressures and I did discuss this  with the family in the room today.  She also will need continued PT and OT for strengthening status post hip fracture she has gained strength but still has significant weakness with ambulating-she is using a walker  Her family also would like her to be off her WelChol-- family feels because of her advanced age that it would be reasonable to discontinue this and I would agree      Previous medical history.  History of left femoral neck fracture status post repair.  Acute  blood loss anemia.  Elevated liver function tests now resolved.  Atrial fibrillation thought to be transient.  Hypertension-hypotension again off Norvasc continues on Avapro.  Social history-no apparent smoking history partial tobacco history no significant alcohol or illicit drug use.  Family history nonpertinent.  Medications.  Aspirin 325 mg daily.  Avapro 150   mg daily.  Colace 100 mg twice a day.  Combigan eyedrops twice a day.  Dulcolax suppository 10 mg once a day when necessary.  Ferrous sulfate 325 mg twice a day. .  Multivitamin daily.  Norco 01/26/2024 milligrams one tablet every 6 hours when necessary pain family has requested this be discontinued.  Simethicone 80 mg every 6 hours when necessary.  Tylenol 650 mg every 6 hours when necessary.  Tylenol 650 mg twice a day at 9 AM and 9 PM routine.  WelChol daily.    Review of systems.  General  does not complaining of any fever or chills.  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  Respiratory no complaint shortness breath or cough.  Cardiac no chest pain or significant lower extremity edema.--Apparently has some dependent edema at times this appears fairly mild today  GU is not complaining of dysuria.  GI does not complain of  abdominal discomfort nausea vomiting diarrhea or constipation currently.  Musculoskeletal at this point hip pain appears to be controlled on Tylenol does not overtly complain of pain-family states she has a high pain tolerance.  Neurologic is not complaining of numbness headache dizziness or syncopal-type feelings-.  Psych she appears grossly alert and oriented does not complain of anxiety or depression appears to be in good spirits.  Physical exam.   She is afebrile pulse 72 respirations 18 blood pressure as noted above variable I got 152/70 sitting-142/70 standing  In general this is a pleasant alert elderly female who looks younger than her stated age she is not in any distress   Her skin is warm and dry she has numerous seborrheic keratosis prominently her neck face.    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  Oropharynx is clear mucous membranes moist.  Chest is clear to auscultation there is no labored breathing.  Heart is regular rate and rhythm with --she has trace lower extremity edema  .  Abdomen is soft nontender positive bowel sounds.  .  Muscle skeletal --is able to move all extremities 4 is able to stand is now able to stand without assistance--and use her walker although she still has somewhat a be weak unsteady gait  Surgical scar left hip femur appears to be healed unremarkably no sign of infection   .  Neurologic is grossly intact her speech is clear no lateralizing findings.  Psych she is alert and oriented pleasant and appropriate.  Labs  04/21/2015.  Sodium 139 potassium 4.4 BUN 35 creatinine 1.9 to.  Albumen 3.2 otherwise liver function tests within normal limits.  WBC 5.5 hemoglobin 9.3 platelets 248.  03/30/2015.  Sodium 141 potassium 5 BUN 35 creatinine 1.61.  WBC 5.7 hemoglobin 9.1 platelets 270.  03/22/2015.  Sodium 140 potassium 4.4 BUN 26  creatinine 1.24.  WBC 5.5 hemoglobin 10.3 platelets 463  03/15/2015.  WBC 6.1 hemoglobin 8.9 platelets 303.  Sodium 140 potassium 4.6 BUN 32 creatinine 1.32-liver function tests within normal limits except albumin of 3.    03/08/2015.  Sodium 139 potassium 3.7 BUN 17 creatinine 1.10.  AST 40 ALT 30 3L: Phosphatase 84 bilirubin 0.9L Bierman 3.0.  WBC 8.5 hemoglobin 9.5 platelets 173.  Assessment and plan.  #1-history of anemia-this was thought to be postop  She is on iron hemoglobin is 9.3 today it appears her hemoglobin has ranged from 9--10 area during her stay here will defer any follow-up to primary care provider clinically appears to be doing well  I have searched her records in the Epic system I do not really see any significant history of anemia rectal bleeding or GI history-patient denies this as well  Occult blood testing has been negative  .  #2 hypertension she continues on Avapro Norvasc DC'd secondary to hypotension concerns-her Avapro was reduced secondary to hypotension  concerns-blood pressure appears variable--secondary to renal insufficiency concerns Will discontinue the Avapro this was discussed with Dr. Shaune Pollack family is very involved and keeps close eye on her blood pressures -- will be monitoring this-and also is being follow-up with her primary care provider has been arranged .   e  #3-chronic kidney disease   Her creatinine appears to be slowly rising again up to 1.92 with a BUN of 35 clinically she appears to be doing well apparently eating and drinking well-again expedient follow-up of primary care provider is being arranged-her Avapro has been discontinued   #4-atrial fibrillation-apparently this is intermittent- Heart rate appears to be controlled today-it is regular  #5 history of elevated liver function tests-this as largely normalized  #6 history hip fracture with repair she has done quite well she is being followed by orthopedics again  she will need continued therapy  Again patient will need continued PT and OT for strengthening as well as home health support follow-up her medical issues-as well as expedient follow-up by primary care provider will need a walker for assistance with ambulation secondary to continued weakness status post hip reppair    99316-of note greater than 30 minutes spent assessing patient-discussing her status with her family in the room-and coordinating and formulating a plan of care-of note greater than 50% of time spent coordinating plan of care with family present       l

## 2015-04-23 DIAGNOSIS — S72002D Fracture of unspecified part of neck of left femur, subsequent encounter for closed fracture with routine healing: Secondary | ICD-10-CM | POA: Diagnosis not present

## 2015-04-23 DIAGNOSIS — N183 Chronic kidney disease, stage 3 (moderate): Secondary | ICD-10-CM | POA: Diagnosis not present

## 2015-04-23 DIAGNOSIS — E1122 Type 2 diabetes mellitus with diabetic chronic kidney disease: Secondary | ICD-10-CM | POA: Diagnosis not present

## 2015-04-23 DIAGNOSIS — I4891 Unspecified atrial fibrillation: Secondary | ICD-10-CM | POA: Diagnosis not present

## 2015-04-23 DIAGNOSIS — I129 Hypertensive chronic kidney disease with stage 1 through stage 4 chronic kidney disease, or unspecified chronic kidney disease: Secondary | ICD-10-CM | POA: Diagnosis not present

## 2015-04-23 DIAGNOSIS — C383 Malignant neoplasm of mediastinum, part unspecified: Secondary | ICD-10-CM | POA: Diagnosis not present

## 2015-04-23 DIAGNOSIS — W19XXXD Unspecified fall, subsequent encounter: Secondary | ICD-10-CM | POA: Diagnosis not present

## 2015-04-23 DIAGNOSIS — E119 Type 2 diabetes mellitus without complications: Secondary | ICD-10-CM | POA: Diagnosis not present

## 2015-04-23 DIAGNOSIS — I10 Essential (primary) hypertension: Secondary | ICD-10-CM | POA: Diagnosis not present

## 2015-04-24 ENCOUNTER — Encounter: Payer: Self-pay | Admitting: Internal Medicine

## 2015-04-24 NOTE — Progress Notes (Signed)
Patient ID: Elizabeth Pierce, female   DOB: Dec 13, 1918, 81 y.o.   MRN: [id number] This is an addendum to the 04/20/2015 note with Rickard Patience.  Of note discussion with the family was actually done on 04/21/2015-again her blood pressure was discussed fairly extensively and family will monitor this closely take actually keep a log of this.   also her orthostatic blood pressures were taken on July 27 not on July 26 as noted initially in the note   Originally -this was intended to be a discharge note but this changed the next day when labs were obtained as well as family was in room and there was follow-up done-  .

## 2015-04-26 DIAGNOSIS — N183 Chronic kidney disease, stage 3 (moderate): Secondary | ICD-10-CM | POA: Diagnosis not present

## 2015-04-26 DIAGNOSIS — W19XXXD Unspecified fall, subsequent encounter: Secondary | ICD-10-CM | POA: Diagnosis not present

## 2015-04-26 DIAGNOSIS — I129 Hypertensive chronic kidney disease with stage 1 through stage 4 chronic kidney disease, or unspecified chronic kidney disease: Secondary | ICD-10-CM | POA: Diagnosis not present

## 2015-04-26 DIAGNOSIS — S72002D Fracture of unspecified part of neck of left femur, subsequent encounter for closed fracture with routine healing: Secondary | ICD-10-CM | POA: Diagnosis not present

## 2015-04-26 DIAGNOSIS — I4891 Unspecified atrial fibrillation: Secondary | ICD-10-CM | POA: Diagnosis not present

## 2015-04-27 DIAGNOSIS — I4891 Unspecified atrial fibrillation: Secondary | ICD-10-CM | POA: Diagnosis not present

## 2015-04-27 DIAGNOSIS — I129 Hypertensive chronic kidney disease with stage 1 through stage 4 chronic kidney disease, or unspecified chronic kidney disease: Secondary | ICD-10-CM | POA: Diagnosis not present

## 2015-04-27 DIAGNOSIS — S72002D Fracture of unspecified part of neck of left femur, subsequent encounter for closed fracture with routine healing: Secondary | ICD-10-CM | POA: Diagnosis not present

## 2015-04-27 DIAGNOSIS — D649 Anemia, unspecified: Secondary | ICD-10-CM | POA: Diagnosis not present

## 2015-04-27 DIAGNOSIS — I10 Essential (primary) hypertension: Secondary | ICD-10-CM | POA: Diagnosis not present

## 2015-04-27 DIAGNOSIS — N183 Chronic kidney disease, stage 3 (moderate): Secondary | ICD-10-CM | POA: Diagnosis not present

## 2015-04-27 DIAGNOSIS — W19XXXD Unspecified fall, subsequent encounter: Secondary | ICD-10-CM | POA: Diagnosis not present

## 2015-04-28 DIAGNOSIS — I4891 Unspecified atrial fibrillation: Secondary | ICD-10-CM | POA: Diagnosis not present

## 2015-04-28 DIAGNOSIS — N183 Chronic kidney disease, stage 3 (moderate): Secondary | ICD-10-CM | POA: Diagnosis not present

## 2015-04-28 DIAGNOSIS — I129 Hypertensive chronic kidney disease with stage 1 through stage 4 chronic kidney disease, or unspecified chronic kidney disease: Secondary | ICD-10-CM | POA: Diagnosis not present

## 2015-04-28 DIAGNOSIS — W19XXXD Unspecified fall, subsequent encounter: Secondary | ICD-10-CM | POA: Diagnosis not present

## 2015-04-28 DIAGNOSIS — S72002D Fracture of unspecified part of neck of left femur, subsequent encounter for closed fracture with routine healing: Secondary | ICD-10-CM | POA: Diagnosis not present

## 2015-04-29 DIAGNOSIS — W19XXXD Unspecified fall, subsequent encounter: Secondary | ICD-10-CM | POA: Diagnosis not present

## 2015-04-29 DIAGNOSIS — N183 Chronic kidney disease, stage 3 (moderate): Secondary | ICD-10-CM | POA: Diagnosis not present

## 2015-04-29 DIAGNOSIS — S72002D Fracture of unspecified part of neck of left femur, subsequent encounter for closed fracture with routine healing: Secondary | ICD-10-CM | POA: Diagnosis not present

## 2015-04-29 DIAGNOSIS — I129 Hypertensive chronic kidney disease with stage 1 through stage 4 chronic kidney disease, or unspecified chronic kidney disease: Secondary | ICD-10-CM | POA: Diagnosis not present

## 2015-04-29 DIAGNOSIS — I4891 Unspecified atrial fibrillation: Secondary | ICD-10-CM | POA: Diagnosis not present

## 2015-04-30 DIAGNOSIS — B009 Herpesviral infection, unspecified: Secondary | ICD-10-CM | POA: Diagnosis not present

## 2015-05-03 DIAGNOSIS — W19XXXD Unspecified fall, subsequent encounter: Secondary | ICD-10-CM | POA: Diagnosis not present

## 2015-05-03 DIAGNOSIS — N183 Chronic kidney disease, stage 3 (moderate): Secondary | ICD-10-CM | POA: Diagnosis not present

## 2015-05-03 DIAGNOSIS — I4891 Unspecified atrial fibrillation: Secondary | ICD-10-CM | POA: Diagnosis not present

## 2015-05-03 DIAGNOSIS — I129 Hypertensive chronic kidney disease with stage 1 through stage 4 chronic kidney disease, or unspecified chronic kidney disease: Secondary | ICD-10-CM | POA: Diagnosis not present

## 2015-05-03 DIAGNOSIS — S72002D Fracture of unspecified part of neck of left femur, subsequent encounter for closed fracture with routine healing: Secondary | ICD-10-CM | POA: Diagnosis not present

## 2015-05-04 DIAGNOSIS — S72002D Fracture of unspecified part of neck of left femur, subsequent encounter for closed fracture with routine healing: Secondary | ICD-10-CM | POA: Diagnosis not present

## 2015-05-04 DIAGNOSIS — I129 Hypertensive chronic kidney disease with stage 1 through stage 4 chronic kidney disease, or unspecified chronic kidney disease: Secondary | ICD-10-CM | POA: Diagnosis not present

## 2015-05-04 DIAGNOSIS — W19XXXD Unspecified fall, subsequent encounter: Secondary | ICD-10-CM | POA: Diagnosis not present

## 2015-05-04 DIAGNOSIS — I4891 Unspecified atrial fibrillation: Secondary | ICD-10-CM | POA: Diagnosis not present

## 2015-05-04 DIAGNOSIS — N183 Chronic kidney disease, stage 3 (moderate): Secondary | ICD-10-CM | POA: Diagnosis not present

## 2015-05-05 DIAGNOSIS — S72002D Fracture of unspecified part of neck of left femur, subsequent encounter for closed fracture with routine healing: Secondary | ICD-10-CM | POA: Diagnosis not present

## 2015-05-05 DIAGNOSIS — W19XXXD Unspecified fall, subsequent encounter: Secondary | ICD-10-CM | POA: Diagnosis not present

## 2015-05-05 DIAGNOSIS — N183 Chronic kidney disease, stage 3 (moderate): Secondary | ICD-10-CM | POA: Diagnosis not present

## 2015-05-05 DIAGNOSIS — I129 Hypertensive chronic kidney disease with stage 1 through stage 4 chronic kidney disease, or unspecified chronic kidney disease: Secondary | ICD-10-CM | POA: Diagnosis not present

## 2015-05-05 DIAGNOSIS — I4891 Unspecified atrial fibrillation: Secondary | ICD-10-CM | POA: Diagnosis not present

## 2015-05-06 DIAGNOSIS — I129 Hypertensive chronic kidney disease with stage 1 through stage 4 chronic kidney disease, or unspecified chronic kidney disease: Secondary | ICD-10-CM | POA: Diagnosis not present

## 2015-05-06 DIAGNOSIS — I480 Paroxysmal atrial fibrillation: Secondary | ICD-10-CM | POA: Diagnosis not present

## 2015-05-06 DIAGNOSIS — N3000 Acute cystitis without hematuria: Secondary | ICD-10-CM | POA: Diagnosis not present

## 2015-05-06 DIAGNOSIS — H4011X4 Primary open-angle glaucoma, indeterminate stage: Secondary | ICD-10-CM | POA: Diagnosis not present

## 2015-05-06 DIAGNOSIS — I639 Cerebral infarction, unspecified: Secondary | ICD-10-CM | POA: Diagnosis not present

## 2015-05-06 DIAGNOSIS — H3531 Nonexudative age-related macular degeneration: Secondary | ICD-10-CM | POA: Diagnosis not present

## 2015-05-06 DIAGNOSIS — N183 Chronic kidney disease, stage 3 (moderate): Secondary | ICD-10-CM | POA: Diagnosis not present

## 2015-05-06 DIAGNOSIS — H16031 Corneal ulcer with hypopyon, right eye: Secondary | ICD-10-CM | POA: Diagnosis not present

## 2015-05-06 DIAGNOSIS — I10 Essential (primary) hypertension: Secondary | ICD-10-CM | POA: Diagnosis not present

## 2015-05-06 DIAGNOSIS — W19XXXD Unspecified fall, subsequent encounter: Secondary | ICD-10-CM | POA: Diagnosis not present

## 2015-05-06 DIAGNOSIS — N390 Urinary tract infection, site not specified: Secondary | ICD-10-CM | POA: Diagnosis not present

## 2015-05-06 DIAGNOSIS — H538 Other visual disturbances: Secondary | ICD-10-CM | POA: Diagnosis not present

## 2015-05-06 DIAGNOSIS — R4182 Altered mental status, unspecified: Secondary | ICD-10-CM | POA: Diagnosis not present

## 2015-05-06 DIAGNOSIS — H3530 Unspecified macular degeneration: Secondary | ICD-10-CM | POA: Diagnosis not present

## 2015-05-06 DIAGNOSIS — S72002D Fracture of unspecified part of neck of left femur, subsequent encounter for closed fracture with routine healing: Secondary | ICD-10-CM | POA: Diagnosis not present

## 2015-05-06 DIAGNOSIS — H53462 Homonymous bilateral field defects, left side: Secondary | ICD-10-CM | POA: Diagnosis not present

## 2015-05-06 DIAGNOSIS — I4891 Unspecified atrial fibrillation: Secondary | ICD-10-CM | POA: Diagnosis not present

## 2015-05-06 DIAGNOSIS — I6340 Cerebral infarction due to embolism of unspecified cerebral artery: Secondary | ICD-10-CM | POA: Diagnosis not present

## 2015-05-07 DIAGNOSIS — I480 Paroxysmal atrial fibrillation: Secondary | ICD-10-CM | POA: Diagnosis present

## 2015-05-07 DIAGNOSIS — N390 Urinary tract infection, site not specified: Secondary | ICD-10-CM | POA: Diagnosis not present

## 2015-05-07 DIAGNOSIS — Z8673 Personal history of transient ischemic attack (TIA), and cerebral infarction without residual deficits: Secondary | ICD-10-CM | POA: Diagnosis not present

## 2015-05-07 DIAGNOSIS — I213 ST elevation (STEMI) myocardial infarction of unspecified site: Secondary | ICD-10-CM | POA: Diagnosis not present

## 2015-05-07 DIAGNOSIS — I639 Cerebral infarction, unspecified: Secondary | ICD-10-CM | POA: Diagnosis not present

## 2015-05-07 DIAGNOSIS — I4891 Unspecified atrial fibrillation: Secondary | ICD-10-CM | POA: Diagnosis not present

## 2015-05-07 DIAGNOSIS — Z87891 Personal history of nicotine dependence: Secondary | ICD-10-CM | POA: Diagnosis not present

## 2015-05-07 DIAGNOSIS — R930 Abnormal findings on diagnostic imaging of skull and head, not elsewhere classified: Secondary | ICD-10-CM | POA: Diagnosis not present

## 2015-05-07 DIAGNOSIS — H3530 Unspecified macular degeneration: Secondary | ICD-10-CM | POA: Diagnosis present

## 2015-05-07 DIAGNOSIS — H538 Other visual disturbances: Secondary | ICD-10-CM | POA: Diagnosis present

## 2015-05-07 DIAGNOSIS — E785 Hyperlipidemia, unspecified: Secondary | ICD-10-CM | POA: Diagnosis not present

## 2015-05-07 DIAGNOSIS — I358 Other nonrheumatic aortic valve disorders: Secondary | ICD-10-CM | POA: Diagnosis not present

## 2015-05-07 DIAGNOSIS — I10 Essential (primary) hypertension: Secondary | ICD-10-CM | POA: Diagnosis not present

## 2015-05-07 DIAGNOSIS — R4182 Altered mental status, unspecified: Secondary | ICD-10-CM | POA: Diagnosis present

## 2015-05-07 DIAGNOSIS — H409 Unspecified glaucoma: Secondary | ICD-10-CM | POA: Diagnosis present

## 2015-05-07 DIAGNOSIS — I6340 Cerebral infarction due to embolism of unspecified cerebral artery: Secondary | ICD-10-CM | POA: Diagnosis present

## 2015-05-07 DIAGNOSIS — R001 Bradycardia, unspecified: Secondary | ICD-10-CM | POA: Diagnosis not present

## 2015-05-07 DIAGNOSIS — E119 Type 2 diabetes mellitus without complications: Secondary | ICD-10-CM | POA: Diagnosis not present

## 2015-05-10 DIAGNOSIS — W19XXXD Unspecified fall, subsequent encounter: Secondary | ICD-10-CM | POA: Diagnosis not present

## 2015-05-10 DIAGNOSIS — S72002D Fracture of unspecified part of neck of left femur, subsequent encounter for closed fracture with routine healing: Secondary | ICD-10-CM | POA: Diagnosis not present

## 2015-05-10 DIAGNOSIS — N183 Chronic kidney disease, stage 3 (moderate): Secondary | ICD-10-CM | POA: Diagnosis not present

## 2015-05-10 DIAGNOSIS — I4891 Unspecified atrial fibrillation: Secondary | ICD-10-CM | POA: Diagnosis not present

## 2015-05-10 DIAGNOSIS — I129 Hypertensive chronic kidney disease with stage 1 through stage 4 chronic kidney disease, or unspecified chronic kidney disease: Secondary | ICD-10-CM | POA: Diagnosis not present

## 2015-05-11 DIAGNOSIS — W19XXXD Unspecified fall, subsequent encounter: Secondary | ICD-10-CM | POA: Diagnosis not present

## 2015-05-11 DIAGNOSIS — I129 Hypertensive chronic kidney disease with stage 1 through stage 4 chronic kidney disease, or unspecified chronic kidney disease: Secondary | ICD-10-CM | POA: Diagnosis not present

## 2015-05-11 DIAGNOSIS — N183 Chronic kidney disease, stage 3 (moderate): Secondary | ICD-10-CM | POA: Diagnosis not present

## 2015-05-11 DIAGNOSIS — S72002D Fracture of unspecified part of neck of left femur, subsequent encounter for closed fracture with routine healing: Secondary | ICD-10-CM | POA: Diagnosis not present

## 2015-05-11 DIAGNOSIS — I4891 Unspecified atrial fibrillation: Secondary | ICD-10-CM | POA: Diagnosis not present

## 2015-05-12 DIAGNOSIS — W19XXXD Unspecified fall, subsequent encounter: Secondary | ICD-10-CM | POA: Diagnosis not present

## 2015-05-12 DIAGNOSIS — I4891 Unspecified atrial fibrillation: Secondary | ICD-10-CM | POA: Diagnosis not present

## 2015-05-12 DIAGNOSIS — N183 Chronic kidney disease, stage 3 (moderate): Secondary | ICD-10-CM | POA: Diagnosis not present

## 2015-05-12 DIAGNOSIS — S72002D Fracture of unspecified part of neck of left femur, subsequent encounter for closed fracture with routine healing: Secondary | ICD-10-CM | POA: Diagnosis not present

## 2015-05-12 DIAGNOSIS — I129 Hypertensive chronic kidney disease with stage 1 through stage 4 chronic kidney disease, or unspecified chronic kidney disease: Secondary | ICD-10-CM | POA: Diagnosis not present

## 2015-05-13 DIAGNOSIS — I129 Hypertensive chronic kidney disease with stage 1 through stage 4 chronic kidney disease, or unspecified chronic kidney disease: Secondary | ICD-10-CM | POA: Diagnosis not present

## 2015-05-13 DIAGNOSIS — I4891 Unspecified atrial fibrillation: Secondary | ICD-10-CM | POA: Diagnosis not present

## 2015-05-13 DIAGNOSIS — N183 Chronic kidney disease, stage 3 (moderate): Secondary | ICD-10-CM | POA: Diagnosis not present

## 2015-05-13 DIAGNOSIS — S72002D Fracture of unspecified part of neck of left femur, subsequent encounter for closed fracture with routine healing: Secondary | ICD-10-CM | POA: Diagnosis not present

## 2015-05-13 DIAGNOSIS — W19XXXD Unspecified fall, subsequent encounter: Secondary | ICD-10-CM | POA: Diagnosis not present

## 2015-05-17 DIAGNOSIS — I4891 Unspecified atrial fibrillation: Secondary | ICD-10-CM | POA: Diagnosis not present

## 2015-05-17 DIAGNOSIS — N183 Chronic kidney disease, stage 3 (moderate): Secondary | ICD-10-CM | POA: Diagnosis not present

## 2015-05-17 DIAGNOSIS — S72002D Fracture of unspecified part of neck of left femur, subsequent encounter for closed fracture with routine healing: Secondary | ICD-10-CM | POA: Diagnosis not present

## 2015-05-17 DIAGNOSIS — W19XXXD Unspecified fall, subsequent encounter: Secondary | ICD-10-CM | POA: Diagnosis not present

## 2015-05-17 DIAGNOSIS — I129 Hypertensive chronic kidney disease with stage 1 through stage 4 chronic kidney disease, or unspecified chronic kidney disease: Secondary | ICD-10-CM | POA: Diagnosis not present

## 2015-05-18 DIAGNOSIS — W19XXXD Unspecified fall, subsequent encounter: Secondary | ICD-10-CM | POA: Diagnosis not present

## 2015-05-18 DIAGNOSIS — N183 Chronic kidney disease, stage 3 (moderate): Secondary | ICD-10-CM | POA: Diagnosis not present

## 2015-05-18 DIAGNOSIS — I4891 Unspecified atrial fibrillation: Secondary | ICD-10-CM | POA: Diagnosis not present

## 2015-05-18 DIAGNOSIS — S72002D Fracture of unspecified part of neck of left femur, subsequent encounter for closed fracture with routine healing: Secondary | ICD-10-CM | POA: Diagnosis not present

## 2015-05-18 DIAGNOSIS — I129 Hypertensive chronic kidney disease with stage 1 through stage 4 chronic kidney disease, or unspecified chronic kidney disease: Secondary | ICD-10-CM | POA: Diagnosis not present

## 2015-05-19 DIAGNOSIS — W19XXXD Unspecified fall, subsequent encounter: Secondary | ICD-10-CM | POA: Diagnosis not present

## 2015-05-19 DIAGNOSIS — I4891 Unspecified atrial fibrillation: Secondary | ICD-10-CM | POA: Diagnosis not present

## 2015-05-19 DIAGNOSIS — I129 Hypertensive chronic kidney disease with stage 1 through stage 4 chronic kidney disease, or unspecified chronic kidney disease: Secondary | ICD-10-CM | POA: Diagnosis not present

## 2015-05-19 DIAGNOSIS — N183 Chronic kidney disease, stage 3 (moderate): Secondary | ICD-10-CM | POA: Diagnosis not present

## 2015-05-19 DIAGNOSIS — S72002D Fracture of unspecified part of neck of left femur, subsequent encounter for closed fracture with routine healing: Secondary | ICD-10-CM | POA: Diagnosis not present

## 2015-05-20 DIAGNOSIS — S72002D Fracture of unspecified part of neck of left femur, subsequent encounter for closed fracture with routine healing: Secondary | ICD-10-CM | POA: Diagnosis not present

## 2015-05-20 DIAGNOSIS — I4891 Unspecified atrial fibrillation: Secondary | ICD-10-CM | POA: Diagnosis not present

## 2015-05-20 DIAGNOSIS — I129 Hypertensive chronic kidney disease with stage 1 through stage 4 chronic kidney disease, or unspecified chronic kidney disease: Secondary | ICD-10-CM | POA: Diagnosis not present

## 2015-05-20 DIAGNOSIS — W19XXXD Unspecified fall, subsequent encounter: Secondary | ICD-10-CM | POA: Diagnosis not present

## 2015-05-20 DIAGNOSIS — N183 Chronic kidney disease, stage 3 (moderate): Secondary | ICD-10-CM | POA: Diagnosis not present

## 2015-05-24 DIAGNOSIS — N183 Chronic kidney disease, stage 3 (moderate): Secondary | ICD-10-CM | POA: Diagnosis not present

## 2015-05-24 DIAGNOSIS — S72002D Fracture of unspecified part of neck of left femur, subsequent encounter for closed fracture with routine healing: Secondary | ICD-10-CM | POA: Diagnosis not present

## 2015-05-24 DIAGNOSIS — I129 Hypertensive chronic kidney disease with stage 1 through stage 4 chronic kidney disease, or unspecified chronic kidney disease: Secondary | ICD-10-CM | POA: Diagnosis not present

## 2015-05-24 DIAGNOSIS — I4891 Unspecified atrial fibrillation: Secondary | ICD-10-CM | POA: Diagnosis not present

## 2015-05-24 DIAGNOSIS — W19XXXD Unspecified fall, subsequent encounter: Secondary | ICD-10-CM | POA: Diagnosis not present

## 2015-05-25 DIAGNOSIS — S72002D Fracture of unspecified part of neck of left femur, subsequent encounter for closed fracture with routine healing: Secondary | ICD-10-CM | POA: Diagnosis not present

## 2015-05-25 DIAGNOSIS — W19XXXD Unspecified fall, subsequent encounter: Secondary | ICD-10-CM | POA: Diagnosis not present

## 2015-05-25 DIAGNOSIS — N183 Chronic kidney disease, stage 3 (moderate): Secondary | ICD-10-CM | POA: Diagnosis not present

## 2015-05-25 DIAGNOSIS — I129 Hypertensive chronic kidney disease with stage 1 through stage 4 chronic kidney disease, or unspecified chronic kidney disease: Secondary | ICD-10-CM | POA: Diagnosis not present

## 2015-05-25 DIAGNOSIS — I4891 Unspecified atrial fibrillation: Secondary | ICD-10-CM | POA: Diagnosis not present

## 2015-05-26 DIAGNOSIS — I4891 Unspecified atrial fibrillation: Secondary | ICD-10-CM | POA: Diagnosis not present

## 2015-05-26 DIAGNOSIS — I129 Hypertensive chronic kidney disease with stage 1 through stage 4 chronic kidney disease, or unspecified chronic kidney disease: Secondary | ICD-10-CM | POA: Diagnosis not present

## 2015-05-26 DIAGNOSIS — S72002D Fracture of unspecified part of neck of left femur, subsequent encounter for closed fracture with routine healing: Secondary | ICD-10-CM | POA: Diagnosis not present

## 2015-05-26 DIAGNOSIS — I639 Cerebral infarction, unspecified: Secondary | ICD-10-CM | POA: Diagnosis not present

## 2015-05-26 DIAGNOSIS — W19XXXD Unspecified fall, subsequent encounter: Secondary | ICD-10-CM | POA: Diagnosis not present

## 2015-05-26 DIAGNOSIS — N183 Chronic kidney disease, stage 3 (moderate): Secondary | ICD-10-CM | POA: Diagnosis not present

## 2015-05-28 DIAGNOSIS — I4891 Unspecified atrial fibrillation: Secondary | ICD-10-CM | POA: Diagnosis not present

## 2015-05-28 DIAGNOSIS — W19XXXD Unspecified fall, subsequent encounter: Secondary | ICD-10-CM | POA: Diagnosis not present

## 2015-05-28 DIAGNOSIS — I129 Hypertensive chronic kidney disease with stage 1 through stage 4 chronic kidney disease, or unspecified chronic kidney disease: Secondary | ICD-10-CM | POA: Diagnosis not present

## 2015-05-28 DIAGNOSIS — N183 Chronic kidney disease, stage 3 (moderate): Secondary | ICD-10-CM | POA: Diagnosis not present

## 2015-05-28 DIAGNOSIS — S72002D Fracture of unspecified part of neck of left femur, subsequent encounter for closed fracture with routine healing: Secondary | ICD-10-CM | POA: Diagnosis not present

## 2015-05-31 DIAGNOSIS — I129 Hypertensive chronic kidney disease with stage 1 through stage 4 chronic kidney disease, or unspecified chronic kidney disease: Secondary | ICD-10-CM | POA: Diagnosis not present

## 2015-05-31 DIAGNOSIS — N183 Chronic kidney disease, stage 3 (moderate): Secondary | ICD-10-CM | POA: Diagnosis not present

## 2015-05-31 DIAGNOSIS — I4891 Unspecified atrial fibrillation: Secondary | ICD-10-CM | POA: Diagnosis not present

## 2015-05-31 DIAGNOSIS — W19XXXD Unspecified fall, subsequent encounter: Secondary | ICD-10-CM | POA: Diagnosis not present

## 2015-05-31 DIAGNOSIS — S72002D Fracture of unspecified part of neck of left femur, subsequent encounter for closed fracture with routine healing: Secondary | ICD-10-CM | POA: Diagnosis not present

## 2015-06-01 DIAGNOSIS — N183 Chronic kidney disease, stage 3 (moderate): Secondary | ICD-10-CM | POA: Diagnosis not present

## 2015-06-01 DIAGNOSIS — I129 Hypertensive chronic kidney disease with stage 1 through stage 4 chronic kidney disease, or unspecified chronic kidney disease: Secondary | ICD-10-CM | POA: Diagnosis not present

## 2015-06-01 DIAGNOSIS — W19XXXD Unspecified fall, subsequent encounter: Secondary | ICD-10-CM | POA: Diagnosis not present

## 2015-06-01 DIAGNOSIS — S72002D Fracture of unspecified part of neck of left femur, subsequent encounter for closed fracture with routine healing: Secondary | ICD-10-CM | POA: Diagnosis not present

## 2015-06-01 DIAGNOSIS — I4891 Unspecified atrial fibrillation: Secondary | ICD-10-CM | POA: Diagnosis not present

## 2015-06-02 DIAGNOSIS — I129 Hypertensive chronic kidney disease with stage 1 through stage 4 chronic kidney disease, or unspecified chronic kidney disease: Secondary | ICD-10-CM | POA: Diagnosis not present

## 2015-06-02 DIAGNOSIS — S72002D Fracture of unspecified part of neck of left femur, subsequent encounter for closed fracture with routine healing: Secondary | ICD-10-CM | POA: Diagnosis not present

## 2015-06-02 DIAGNOSIS — N183 Chronic kidney disease, stage 3 (moderate): Secondary | ICD-10-CM | POA: Diagnosis not present

## 2015-06-02 DIAGNOSIS — W19XXXD Unspecified fall, subsequent encounter: Secondary | ICD-10-CM | POA: Diagnosis not present

## 2015-06-02 DIAGNOSIS — I4891 Unspecified atrial fibrillation: Secondary | ICD-10-CM | POA: Diagnosis not present

## 2015-06-03 ENCOUNTER — Ambulatory Visit (INDEPENDENT_AMBULATORY_CARE_PROVIDER_SITE_OTHER): Payer: Medicare Other

## 2015-06-03 ENCOUNTER — Encounter: Payer: Self-pay | Admitting: Orthopedic Surgery

## 2015-06-03 ENCOUNTER — Ambulatory Visit (INDEPENDENT_AMBULATORY_CARE_PROVIDER_SITE_OTHER): Payer: Self-pay | Admitting: Orthopedic Surgery

## 2015-06-03 VITALS — BP 108/63 | Ht 61.0 in | Wt 114.0 lb

## 2015-06-03 DIAGNOSIS — I639 Cerebral infarction, unspecified: Secondary | ICD-10-CM | POA: Diagnosis not present

## 2015-06-03 DIAGNOSIS — B029 Zoster without complications: Secondary | ICD-10-CM | POA: Diagnosis not present

## 2015-06-03 DIAGNOSIS — I482 Chronic atrial fibrillation: Secondary | ICD-10-CM | POA: Diagnosis not present

## 2015-06-03 DIAGNOSIS — N390 Urinary tract infection, site not specified: Secondary | ICD-10-CM | POA: Diagnosis not present

## 2015-06-03 DIAGNOSIS — S72142D Displaced intertrochanteric fracture of left femur, subsequent encounter for closed fracture with routine healing: Secondary | ICD-10-CM

## 2015-06-03 DIAGNOSIS — S72001D Fracture of unspecified part of neck of right femur, subsequent encounter for closed fracture with routine healing: Secondary | ICD-10-CM

## 2015-06-03 NOTE — Progress Notes (Signed)
Patient ID: Elizabeth Pierce, female   DOB: 1919-03-23, 81 y.o.   MRN: [id number]  Follow up visit  Chief Complaint  Patient presents with  . Follow-up    8 week follow up + xray left hip, DOS 03/06/15    BP 108/63 mmHg  Ht 5\' 1"  (1.549 m)  Wt 114 lb (51.71 kg)  BMI 21.55 kg/m2  Encounter Diagnosis  Name Primary?  . Femoral neck fracture, right, closed, with routine healing, subsequent encounter Yes    Status post femoral neck fracture with cannulated screw fixation patient did well. Although review of systems the patient had a shingles attack right over the incision and she also had a stroke and is now on baby aspirin. She had her 89th birthday she walks without assistive device she completed therapy and was released she has excellent hip function and her x-ray shows healing she has been released today

## 2015-06-04 DIAGNOSIS — I4891 Unspecified atrial fibrillation: Secondary | ICD-10-CM | POA: Diagnosis not present

## 2015-06-04 DIAGNOSIS — N183 Chronic kidney disease, stage 3 (moderate): Secondary | ICD-10-CM | POA: Diagnosis not present

## 2015-06-04 DIAGNOSIS — I129 Hypertensive chronic kidney disease with stage 1 through stage 4 chronic kidney disease, or unspecified chronic kidney disease: Secondary | ICD-10-CM | POA: Diagnosis not present

## 2015-06-04 DIAGNOSIS — S72002D Fracture of unspecified part of neck of left femur, subsequent encounter for closed fracture with routine healing: Secondary | ICD-10-CM | POA: Diagnosis not present

## 2015-06-04 DIAGNOSIS — W19XXXD Unspecified fall, subsequent encounter: Secondary | ICD-10-CM | POA: Diagnosis not present

## 2015-06-07 DIAGNOSIS — W19XXXD Unspecified fall, subsequent encounter: Secondary | ICD-10-CM | POA: Diagnosis not present

## 2015-06-07 DIAGNOSIS — I4891 Unspecified atrial fibrillation: Secondary | ICD-10-CM | POA: Diagnosis not present

## 2015-06-07 DIAGNOSIS — S72002D Fracture of unspecified part of neck of left femur, subsequent encounter for closed fracture with routine healing: Secondary | ICD-10-CM | POA: Diagnosis not present

## 2015-06-07 DIAGNOSIS — I129 Hypertensive chronic kidney disease with stage 1 through stage 4 chronic kidney disease, or unspecified chronic kidney disease: Secondary | ICD-10-CM | POA: Diagnosis not present

## 2015-06-07 DIAGNOSIS — N183 Chronic kidney disease, stage 3 (moderate): Secondary | ICD-10-CM | POA: Diagnosis not present

## 2015-06-18 DIAGNOSIS — I4891 Unspecified atrial fibrillation: Secondary | ICD-10-CM | POA: Diagnosis not present

## 2015-06-18 DIAGNOSIS — N183 Chronic kidney disease, stage 3 (moderate): Secondary | ICD-10-CM | POA: Diagnosis not present

## 2015-06-18 DIAGNOSIS — S72002D Fracture of unspecified part of neck of left femur, subsequent encounter for closed fracture with routine healing: Secondary | ICD-10-CM | POA: Diagnosis not present

## 2015-06-18 DIAGNOSIS — I129 Hypertensive chronic kidney disease with stage 1 through stage 4 chronic kidney disease, or unspecified chronic kidney disease: Secondary | ICD-10-CM | POA: Diagnosis not present

## 2015-06-18 DIAGNOSIS — W19XXXD Unspecified fall, subsequent encounter: Secondary | ICD-10-CM | POA: Diagnosis not present

## 2015-06-21 ENCOUNTER — Emergency Department (HOSPITAL_COMMUNITY): Payer: Medicare Other

## 2015-06-21 ENCOUNTER — Observation Stay (HOSPITAL_COMMUNITY): Payer: Medicare Other

## 2015-06-21 ENCOUNTER — Encounter (HOSPITAL_COMMUNITY): Payer: Self-pay | Admitting: Cardiology

## 2015-06-21 ENCOUNTER — Observation Stay (HOSPITAL_COMMUNITY)
Admission: EM | Admit: 2015-06-21 | Discharge: 2015-06-22 | Disposition: A | Discharge status: Home / Self Care | Payer: Medicare Other | Attending: Internal Medicine | Admitting: Internal Medicine

## 2015-06-21 DIAGNOSIS — R531 Weakness: Secondary | ICD-10-CM

## 2015-06-21 DIAGNOSIS — Z79899 Other long term (current) drug therapy: Secondary | ICD-10-CM | POA: Diagnosis not present

## 2015-06-21 DIAGNOSIS — R4182 Altered mental status, unspecified: Secondary | ICD-10-CM | POA: Diagnosis not present

## 2015-06-21 DIAGNOSIS — I6340 Cerebral infarction due to embolism of unspecified cerebral artery: Secondary | ICD-10-CM | POA: Diagnosis not present

## 2015-06-21 DIAGNOSIS — R29810 Facial weakness: Secondary | ICD-10-CM | POA: Diagnosis not present

## 2015-06-21 DIAGNOSIS — H26103 Unspecified traumatic cataract, bilateral: Secondary | ICD-10-CM | POA: Diagnosis not present

## 2015-06-21 DIAGNOSIS — I639 Cerebral infarction, unspecified: Secondary | ICD-10-CM | POA: Diagnosis present

## 2015-06-21 DIAGNOSIS — E875 Hyperkalemia: Secondary | ICD-10-CM | POA: Diagnosis present

## 2015-06-21 DIAGNOSIS — I4891 Unspecified atrial fibrillation: Secondary | ICD-10-CM | POA: Diagnosis present

## 2015-06-21 DIAGNOSIS — H269 Unspecified cataract: Secondary | ICD-10-CM

## 2015-06-21 DIAGNOSIS — I6789 Other cerebrovascular disease: Secondary | ICD-10-CM | POA: Diagnosis not present

## 2015-06-21 DIAGNOSIS — I480 Paroxysmal atrial fibrillation: Secondary | ICD-10-CM

## 2015-06-21 DIAGNOSIS — Z23 Encounter for immunization: Secondary | ICD-10-CM | POA: Diagnosis not present

## 2015-06-21 DIAGNOSIS — N179 Acute kidney failure, unspecified: Secondary | ICD-10-CM | POA: Diagnosis present

## 2015-06-21 DIAGNOSIS — Z7982 Long term (current) use of aspirin: Secondary | ICD-10-CM | POA: Insufficient documentation

## 2015-06-21 DIAGNOSIS — D649 Anemia, unspecified: Secondary | ICD-10-CM | POA: Diagnosis present

## 2015-06-21 DIAGNOSIS — I6523 Occlusion and stenosis of bilateral carotid arteries: Secondary | ICD-10-CM | POA: Diagnosis not present

## 2015-06-21 HISTORY — DX: Cerebral infarction, unspecified: I63.9

## 2015-06-21 HISTORY — DX: Unspecified cataract: H26.9

## 2015-06-21 LAB — CBC
HEMATOCRIT: 34.5 % — AB (ref 36.0–46.0)
Hemoglobin: 10.9 g/dL — ABNORMAL LOW (ref 12.0–15.0)
MCH: 28.3 pg (ref 26.0–34.0)
MCHC: 31.6 g/dL (ref 30.0–36.0)
MCV: 89.6 fL (ref 78.0–100.0)
Platelets: 232 10*3/uL (ref 150–400)
RBC: 3.85 MIL/uL — ABNORMAL LOW (ref 3.87–5.11)
RDW: 14.4 % (ref 11.5–15.5)
WBC: 5.7 10*3/uL (ref 4.0–10.5)

## 2015-06-21 LAB — DIFFERENTIAL
BASOS PCT: 1 %
Basophils Absolute: 0.1 10*3/uL (ref 0.0–0.1)
EOS PCT: 5 %
Eosinophils Absolute: 0.3 10*3/uL (ref 0.0–0.7)
Lymphocytes Relative: 21 %
Lymphs Abs: 1.2 10*3/uL (ref 0.7–4.0)
MONO ABS: 0.6 10*3/uL (ref 0.1–1.0)
MONOS PCT: 11 %
NEUTROS ABS: 3.5 10*3/uL (ref 1.7–7.7)
Neutrophils Relative %: 62 %

## 2015-06-21 LAB — COMPREHENSIVE METABOLIC PANEL
ALT: 12 U/L — AB (ref 14–54)
AST: 22 U/L (ref 15–41)
Albumin: 3.5 g/dL (ref 3.5–5.0)
Alkaline Phosphatase: 48 U/L (ref 38–126)
Anion gap: 5 (ref 5–15)
BUN: 20 mg/dL (ref 6–20)
CALCIUM: 8.7 mg/dL — AB (ref 8.9–10.3)
CHLORIDE: 106 mmol/L (ref 101–111)
CO2: 30 mmol/L (ref 22–32)
CREATININE: 1.43 mg/dL — AB (ref 0.44–1.00)
GFR, EST AFRICAN AMERICAN: 35 mL/min — AB (ref >60)
GFR, EST NON AFRICAN AMERICAN: 30 mL/min — AB (ref >60)
Glucose, Bld: 123 mg/dL — ABNORMAL HIGH (ref 65–99)
Potassium: 4 mmol/L (ref 3.5–5.1)
Sodium: 141 mmol/L (ref 135–145)
Total Bilirubin: 0.4 mg/dL (ref 0.3–1.2)
Total Protein: 6.6 g/dL (ref 6.5–8.1)

## 2015-06-21 LAB — URINALYSIS, ROUTINE W REFLEX MICROSCOPIC
Bilirubin Urine: NEGATIVE
Glucose, UA: NEGATIVE mg/dL
Hgb urine dipstick: NEGATIVE
Ketones, ur: NEGATIVE mg/dL
LEUKOCYTES UA: NEGATIVE
Nitrite: NEGATIVE
PROTEIN: NEGATIVE mg/dL
Specific Gravity, Urine: 1.015 (ref 1.005–1.030)
UROBILINOGEN UA: 0.2 mg/dL (ref 0.0–1.0)
pH: 6 (ref 5.0–8.0)

## 2015-06-21 LAB — I-STAT CHEM 8, ED
BUN: 29 mg/dL — ABNORMAL HIGH (ref 6–20)
CALCIUM ION: 1.23 mmol/L (ref 1.13–1.30)
Chloride: 102 mmol/L (ref 101–111)
Creatinine, Ser: 1.6 mg/dL — ABNORMAL HIGH (ref 0.44–1.00)
GLUCOSE: 128 mg/dL — AB (ref 65–99)
HCT: 35 % — ABNORMAL LOW (ref 36.0–46.0)
HEMOGLOBIN: 11.9 g/dL — AB (ref 12.0–15.0)
Potassium: 5.3 mmol/L — ABNORMAL HIGH (ref 3.5–5.1)
SODIUM: 140 mmol/L (ref 135–145)
TCO2: 30 mmol/L (ref 0–100)

## 2015-06-21 LAB — RAPID URINE DRUG SCREEN, HOSP PERFORMED
Amphetamines: NOT DETECTED
Barbiturates: NOT DETECTED
Benzodiazepines: NOT DETECTED
Cocaine: NOT DETECTED
Opiates: NOT DETECTED
Tetrahydrocannabinol: NOT DETECTED

## 2015-06-21 LAB — I-STAT TROPONIN, ED: TROPONIN I, POC: 0.02 ng/mL (ref 0.00–0.08)

## 2015-06-21 LAB — LIPID PANEL
CHOL/HDL RATIO: 3.6 ratio
Cholesterol: 162 mg/dL (ref 0–200)
HDL: 45 mg/dL (ref >40)
LDL Cholesterol: 80 mg/dL (ref 0–99)
Triglycerides: 187 mg/dL — ABNORMAL HIGH (ref <150)
VLDL: 37 mg/dL (ref 0–40)

## 2015-06-21 LAB — TROPONIN I

## 2015-06-21 LAB — ETHANOL: ALCOHOL ETHYL (B): 6 mg/dL — AB (ref <5)

## 2015-06-21 LAB — PROTIME-INR
INR: 1.08 (ref 0.00–1.49)
Prothrombin Time: 14.2 seconds (ref 11.6–15.2)

## 2015-06-21 LAB — CBG MONITORING, ED: GLUCOSE-CAPILLARY: 137 mg/dL — AB (ref 65–99)

## 2015-06-21 LAB — APTT: aPTT: 26 seconds (ref 24–37)

## 2015-06-21 MED ORDER — SODIUM CHLORIDE 0.9 % IV SOLN
INTRAVENOUS | Status: AC
  Administered 2015-06-21: 17:00:00 via INTRAVENOUS

## 2015-06-21 MED ORDER — BRIMONIDINE TARTRATE-TIMOLOL 0.2-0.5 % OP SOLN: 1.0000 [drp] | Freq: Two times a day (BID) | OPHTHALMIC | Status: DC

## 2015-06-21 MED ORDER — ASPIRIN 325 MG PO TABS: 325.0000 mg | ORAL_TABLET | Freq: Every day | ORAL | Status: DC

## 2015-06-21 MED ORDER — STROKE: EARLY STAGES OF RECOVERY BOOK
Freq: Once | Status: AC
  Administered 2015-06-21: 18:00:00
  Filled 2015-06-21: qty 1

## 2015-06-21 MED ORDER — APIXABAN 5 MG PO TABS
2.5000 mg | ORAL_TABLET | Freq: Two times a day (BID) | ORAL | Status: DC
  Administered 2015-06-21 – 2015-06-22 (×2): 2.5 mg via ORAL
  Filled 2015-06-21 (×2): qty 1

## 2015-06-21 MED ORDER — LATANOPROST 0.005 % OP SOLN
1.0000 [drp] | Freq: Every day | OPHTHALMIC | Status: DC
  Administered 2015-06-21: 1 [drp] via OPHTHALMIC
  Filled 2015-06-21: qty 2.5

## 2015-06-21 MED ORDER — ASPIRIN 300 MG RE SUPP
300.0000 mg | Freq: Every day | RECTAL | Status: DC
  Filled 2015-06-21 (×3): qty 1

## 2015-06-21 MED ORDER — SENNOSIDES-DOCUSATE SODIUM 8.6-50 MG PO TABS: 1.0000 | ORAL_TABLET | Freq: Every evening | ORAL | Status: DC | PRN

## 2015-06-21 MED ORDER — ENOXAPARIN SODIUM 30 MG/0.3ML ~~LOC~~ SOLN: 30.0000 mg | SUBCUTANEOUS | Status: DC

## 2015-06-21 MED ORDER — SIMETHICONE 80 MG PO CHEW: 80.0000 mg | CHEWABLE_TABLET | Freq: Four times a day (QID) | ORAL | Status: DC | PRN

## 2015-06-21 MED ORDER — FERROUS SULFATE 325 (65 FE) MG PO TABS
325.0000 mg | ORAL_TABLET | Freq: Every day | ORAL | Status: DC
  Administered 2015-06-22: 325 mg via ORAL
  Filled 2015-06-21: qty 1

## 2015-06-21 MED ORDER — INFLUENZA VAC SPLIT QUAD 0.5 ML IM SUSY
0.5000 mL | PREFILLED_SYRINGE | INTRAMUSCULAR | Status: AC
  Administered 2015-06-22: 0.5 mL via INTRAMUSCULAR
  Filled 2015-06-21: qty 0.5

## 2015-06-21 MED ORDER — PNEUMOCOCCAL VAC POLYVALENT 25 MCG/0.5ML IJ INJ
0.5000 mL | INJECTION | INTRAMUSCULAR | Status: AC
  Administered 2015-06-22: 0.5 mL via INTRAMUSCULAR
  Filled 2015-06-21: qty 0.5

## 2015-06-21 MED ORDER — BRIMONIDINE TARTRATE 0.2 % OP SOLN
1.0000 [drp] | Freq: Two times a day (BID) | OPHTHALMIC | Status: DC
  Administered 2015-06-21 – 2015-06-22 (×2): 1 [drp] via OPHTHALMIC
  Filled 2015-06-21: qty 5

## 2015-06-21 MED ORDER — SODIUM POLYSTYRENE SULFONATE 15 GM/60ML PO SUSP
30.0000 g | Freq: Once | ORAL | Status: AC
  Administered 2015-06-21: 30 g via ORAL
  Filled 2015-06-21: qty 120

## 2015-06-21 MED ORDER — TIMOLOL MALEATE 0.5 % OP SOLN
1.0000 [drp] | Freq: Two times a day (BID) | OPHTHALMIC | Status: DC
  Administered 2015-06-21 – 2015-06-22 (×2): 1 [drp] via OPHTHALMIC
  Filled 2015-06-21: qty 5

## 2015-06-21 MED ORDER — DOCUSATE SODIUM 100 MG PO CAPS
100.0000 mg | ORAL_CAPSULE | Freq: Every day | ORAL | Status: DC
  Administered 2015-06-21 – 2015-06-22 (×2): 100 mg via ORAL
  Filled 2015-06-21 (×2): qty 1

## 2015-06-21 MED ORDER — ACETAMINOPHEN 325 MG PO TABS
650.0000 mg | ORAL_TABLET | Freq: Four times a day (QID) | ORAL | Status: DC | PRN
  Administered 2015-06-22: 650 mg via ORAL
  Filled 2015-06-21: qty 2

## 2015-06-21 NOTE — ED Notes (Signed)
Onset 55 min ago.  Right sided weakness and confusion.  Sudden onset.

## 2015-06-21 NOTE — ED Provider Notes (Signed)
CSN: [id number]     Arrival date & time 06/21/15  1102 History  This chart was scribed for Ezequiel Essex, MD by Eustaquio Maize, ED Scribe. This patient was seen in room APA03/APA03 and the patient's care was started at 10:59 AM.  Chief Complaint  Patient presents with  . Code Stroke   LEVEL 5 CAVEAT for altered mental status and acuity of condition  The history is provided by the patient and the EMS personnel. No language interpreter was used.     HPI Comments: Elizabeth Pierce is a 81 y.o. female brought in by ambulance, with hx stroke approximately 1 month ago who presents to the Emergency Department complaining of stroke like symptoms that began this morning around 10 AM (approximately 1 hour ago). Per EMS, family last saw pt normal at 9 AM this morning. Family called EMS because pt was more confused than normal. EMS states they noticed a facial droop, right sided weakness, and difficulty following commands. Pt denies headache, chest pain, abdominal pain, or any other associated symptoms.   Family reports that pt had shingles in August and was acting more confused than normal. Family attributed her confusion with her shingles medication. Family took pt to [facility] and was diagnosed with a stroke on 05/06/2015 (approxiamtely 1.5 months ago). Family states that they were told at [facility] that pt had had a previous stroke that was undiagnosed. Family states that pt was able to eat breakfast and take a bath this morning without difficulty. Pt was sitting at the table and was transferring from the table to a chair with her walker when her right arm became weak, prompting family to call EMS. They do not believe pt had a facial droop.  Past Medical History  Diagnosis Date  . Stroke   . Cataracts, bilateral    History reviewed. No pertinent past surgical history. History reviewed. No pertinent family history. Social History  Substance Use Topics  . Smoking status: Never Smoker   . Smokeless  tobacco: Current User    Types: Snuff  . Alcohol Use: None   OB History    No data available     Review of Systems  Unable to perform ROS: Mental status change      Allergies  Tramadol  Home Medications   Prior to Admission medications   Medication Sig Start Date End Date Taking? Authorizing Provider  acetaminophen (TYLENOL) 325 MG tablet Take 650 mg by mouth every 6 (six) hours as needed.   Yes Historical Provider, MD  aspirin EC 81 MG tablet Take 81 mg by mouth daily.   Yes Historical Provider, MD  brimonidine-timolol (COMBIGAN) 0.2-0.5 % ophthalmic solution Place 1 drop into the left eye every 12 (twelve) hours.   Yes Historical Provider, MD  docusate sodium (COLACE) 100 MG capsule Take 100 mg by mouth daily.   Yes Historical Provider, MD  Ferrous Sulfate Dried (SLOW RELEASE IRON) 45 MG TBCR Take 1 tablet by mouth daily.   Yes Historical Provider, MD  Multiple Vitamin (MULTI-VITAMIN DAILY PO) Take 1 tablet by mouth daily.   Yes Historical Provider, MD  Multiple Vitamins-Minerals (PRESERVISION AREDS PO) Take 1 tablet by mouth daily.   Yes Historical Provider, MD  simethicone (MYLICON) 80 MG chewable tablet Chew 80 mg by mouth every 6 (six) hours as needed for flatulence.   Yes Historical Provider, MD  travoprost, benzalkonium, (TRAVATAN) 0.004 % ophthalmic solution Place 1 drop into the left eye at bedtime.   Yes Historical Provider, MD  Triage Vitals: BP 172/81 mmHg  Pulse 60  Temp(Src) 98.2 F (36.8 C) (Oral)  Resp 16  Ht 5\' 4"  (1.626 m)  Wt 145 lb (65.772 kg)  BMI 24.88 kg/m2  SpO2 99%   Physical Exam  Constitutional: She is oriented to person, place, and time. She appears well-developed and well-nourished. No distress.  HENT:  Head: Normocephalic and atraumatic.  Mouth/Throat: Oropharynx is clear and moist. No oropharyngeal exudate.  Eyes: Conjunctivae and EOM are normal. Pupils are equal, round, and reactive to light.  Neck: Normal range of motion. Neck supple.   No meningismus.  Cardiovascular: Normal rate, regular rhythm, normal heart sounds and intact distal pulses.   No murmur heard. Pulmonary/Chest: Effort normal and breath sounds normal. No respiratory distress.  Abdominal: Soft. There is no tenderness. There is no rebound and no guarding.  Musculoskeletal: Normal range of motion. She exhibits no edema or tenderness.  Neurological: She is alert and oriented to person, place, and time. No cranial nerve deficit. She exhibits normal muscle tone. Coordination normal.  No facial droop seen.Tongue midline. Pt oriented x 2. Seems to have right sided neglect but moving all extremities equally. Some difficulty following commands on right side. 5/5 strength left arm and left leg. 5/5 strength right leg. Unable to follow commands with right arm.   Skin: Skin is warm.  Psychiatric: She has a normal mood and affect. Her behavior is normal.  Nursing note and vitals reviewed.   ED Course  Procedures (including critical care time)  DIAGNOSTIC STUDIES: Oxygen Saturation is 99% on RA, normal by my interpretation.    COORDINATION OF CARE: 11:08 AM- Discussed treatment plan which includes CT Head with pt at bedside and pt agreed to plan.   Labs Review Labs Reviewed  ETHANOL - Abnormal; Notable for the following:    Alcohol, Ethyl (B) 6 (*)    All other components within normal limits  CBC - Abnormal; Notable for the following:    RBC 3.85 (*)    Hemoglobin 10.9 (*)    HCT 34.5 (*)    All other components within normal limits  COMPREHENSIVE METABOLIC PANEL - Abnormal; Notable for the following:    Glucose, Bld 123 (*)    Creatinine, Ser 1.43 (*)    Calcium 8.7 (*)    ALT 12 (*)    GFR calc non Af Amer 30 (*)    GFR calc Af Amer 35 (*)    All other components within normal limits  LIPID PANEL - Abnormal; Notable for the following:    Triglycerides 187 (*)    All other components within normal limits  I-STAT CHEM 8, ED - Abnormal; Notable for the  following:    Potassium 5.3 (*)    BUN 29 (*)    Creatinine, Ser 1.60 (*)    Glucose, Bld 128 (*)    Hemoglobin 11.9 (*)    HCT 35.0 (*)    All other components within normal limits  CBG MONITORING, ED - Abnormal; Notable for the following:    Glucose-Capillary 137 (*)    All other components within normal limits  PROTIME-INR  APTT  DIFFERENTIAL  URINE RAPID DRUG SCREEN, HOSP PERFORMED  URINALYSIS, ROUTINE W REFLEX MICROSCOPIC (NOT AT [facility])  HEMOGLOBIN A1C  TROPONIN I  TROPONIN I  I-STAT TROPOININ, ED    Imaging Review Ct Head Wo Contrast  06/21/2015   CLINICAL DATA:  Right-sided weakness for 2 hours  EXAM: CT HEAD WITHOUT CONTRAST  TECHNIQUE: Contiguous  axial images were obtained from the base of the skull through the vertex without intravenous contrast.  COMPARISON:  None.  FINDINGS: There is moderate diffuse atrophy. There is no intracranial mass, hemorrhage, acute appearing extra-axial fluid collection, or midline shift. There is decreased attenuation in the right medial parieto-occipital junction region, concerning for recent infarct. There is a small infarct in the anterior superior left thalamus concerning for second focus of recent infarct. There is mild small vessel disease in the centra semiovale bilaterally. Elsewhere gray-white compartments appear normal. The bony calvarium appears intact. The mastoid air cells are clear. 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  IMPRESSION: Atrophy with periventricular small vessel disease. Areas concerning for recent/acute infarct in the medial parietal-occipital junction on the right as well as in the anterior superior left thalamus. No hemorrhage or mass effect.  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  Critical Value/emergent results were called by telephone at the time of interpretation on 06/21/2015 at 11:19 am to Dr. Ezequiel Essex , who verbally acknowledged these results.   Electronically Signed   By: Lowella Grip III M.D.   On:  06/21/2015 11:22   Mr Jodene Nam Head Wo Contrast  06/21/2015   CLINICAL DATA:  Altered mental status and RIGHT-sided weakness for 1 day.  EXAM: MRI HEAD WITHOUT CONTRAST  MRA HEAD WITHOUT CONTRAST  TECHNIQUE: Multiplanar, multiecho pulse sequences of the brain and surrounding structures were obtained without intravenous contrast. Angiographic images of the head were obtained using MRA technique without contrast.  COMPARISON:  CT head earlier today.  FINDINGS: MRI HEAD FINDINGS  The patient was unable to remain motionless for the exam. Small or subtle lesions could be overlooked.  Multifocal areas of acute infarction affect the LEFT hemisphere primarily, LEFT posterior frontal and anterior parietal cortex and subcortical white matter. No acute hemorrhage.  No mass lesion, hydrocephalus, or extra-axial. Suspected laminar necrosis associated with an old RIGHT PCA territory infarction, although difficult to to exclude completely small associated areas of additional RIGHT PCA territory acute infarction.  Generalized atrophy. Chronic microvascular ischemic change. Marked pannus surrounding C2 mildly effaces the cervicomedullary junction without definite cord compression. Large remote LEFT thalamic lacunar infarct. 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 LEFT cataract extraction. No acute sinus or mastoid disease.  MRA HEAD FINDINGS  Of misregistration due to motion. No gross carotid or basilar stenosis. Vertebrals contribute to basilar formation, LEFT dominant. Mild irregularity of the proximal anterior, middle, and posterior cerebral arteries without apparent flow limiting stenosis. Moderately disease T2 and T3 MCA segments bilaterally, without definite proximal branch occlusion. Both distal PCAs are patent. Difficult to assess for cerebellar branch occlusion.  IMPRESSION: Motion degraded exam demonstrating acute LEFT hemisphere infarction involving the posterior frontal, anterior parietal cortex and subcortical white matter. No  acute hemorrhage.  Chronic RIGHT PCA infarct with laminar necrosis.  Atrophy and small vessel disease.  Within limits of detection due to motion, no proximal large vessel occlusion on MRA. Intracranial atherosclerotic change as described.   Electronically Signed   By: Staci Righter M.D.   On: 06/21/2015 14:19   Mr Brain Wo Contrast  06/21/2015   CLINICAL DATA:  Altered mental status and RIGHT-sided weakness for 1 day.  EXAM: MRI HEAD WITHOUT CONTRAST  MRA HEAD WITHOUT CONTRAST  TECHNIQUE: Multiplanar, multiecho pulse sequences of the brain and surrounding structures were obtained without intravenous contrast. Angiographic images of the head were obtained using MRA technique without contrast.  COMPARISON:  CT head earlier today.  FINDINGS: MRI HEAD FINDINGS  The patient was unable to remain motionless for the exam. Small or subtle lesions could be overlooked.  Multifocal areas of acute infarction affect the LEFT hemisphere primarily, LEFT posterior frontal and anterior parietal cortex and subcortical white matter. No acute hemorrhage.  No mass lesion, hydrocephalus, or extra-axial. Suspected laminar necrosis associated with an old RIGHT PCA territory infarction, although difficult to to exclude completely small associated areas of additional RIGHT PCA territory acute infarction.  Generalized atrophy. Chronic microvascular ischemic change. Marked pannus surrounding C2 mildly effaces the cervicomedullary junction without definite cord compression. Large remote LEFT thalamic lacunar infarct. 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 LEFT cataract extraction. No acute sinus or mastoid disease.  MRA HEAD FINDINGS  Of misregistration due to motion. No gross carotid or basilar stenosis. Vertebrals contribute to basilar formation, LEFT dominant. Mild irregularity of the proximal anterior, middle, and posterior cerebral arteries without apparent flow limiting stenosis. Moderately disease T2 and T3 MCA segments bilaterally, without  definite proximal branch occlusion. Both distal PCAs are patent. Difficult to assess for cerebellar branch occlusion.  IMPRESSION: Motion degraded exam demonstrating acute LEFT hemisphere infarction involving the posterior frontal, anterior parietal cortex and subcortical white matter. No acute hemorrhage.  Chronic RIGHT PCA infarct with laminar necrosis.  Atrophy and small vessel disease.  Within limits of detection due to motion, no proximal large vessel occlusion on MRA. Intracranial atherosclerotic change as described.   Electronically Signed   By: Staci Righter M.D.   On: 06/21/2015 14:19   US Carotid Bilateral  06/21/2015   CLINICAL DATA:  Right-sided weakness and stroke.  EXAM: BILATERAL CAROTID DUPLEX ULTRASOUND  TECHNIQUE: Pearline Cables scale imaging, color Doppler and duplex ultrasound were performed of bilateral carotid and vertebral arteries in the neck.  COMPARISON:  MRI 06/21/2015  FINDINGS: Criteria: Quantification of carotid stenosis is based on velocity parameters that correlate the residual internal carotid diameter with NASCET-based stenosis levels, using the diameter of the distal internal carotid lumen as the denominator for stenosis measurement.  The following velocity measurements were obtained:  RIGHT  ICA:  88 cm/sec  CCA:  54 cm/sec  SYSTOLIC ICA/CCA RATIO:  1.6  DIASTOLIC ICA/CCA RATIO:  1.7  ECA:  69 cm/sec  LEFT  ICA:  74 cm/sec  CCA:  48 cm/sec  SYSTOLIC ICA/CCA RATIO:  1.5  DIASTOLIC ICA/CCA RATIO:  1.5  ECA:  113 cm/sec  RIGHT CAROTID ARTERY: Intimal thickening in the right common carotid artery. Small amount of plaque in the proximal internal carotid artery without significant stenosis.  RIGHT VERTEBRAL ARTERY: Antegrade flow and normal waveform in the right vertebral artery.  LEFT CAROTID ARTERY: Mild intimal thickening in the left common carotid artery. Mild plaque at the left carotid bulb. Left internal carotid artery is tortuous without significant plaque or stenosis.  LEFT VERTEBRAL  ARTERY: Antegrade flow and normal waveform in the left vertebral artery.  IMPRESSION: Mild atherosclerotic disease in the carotid arteries without significant stenosis. Estimated degree of stenosis in the internal carotid arteries is less than 50% bilaterally.  Patent vertebral arteries.   Electronically Signed   By: Markus Daft M.D.   On: 06/21/2015 16:46   I have personally reviewed and evaluated these images and lab results as part of my medical decision-making.   EKG Interpretation   Date/Time:  Monday June 21 2015 11:16:44 EDT Ventricular Rate:  59 PR Interval:  188 QRS Duration: 78 QT Interval:  479 QTC Calculation: 474 R Axis:   68 Text Interpretation:  Sinus rhythm No previous ECGs available Confirmed by  RANCOUR  MD, STEPHEN [phone]) on 06/21/2015 12:30:01 PM      MDM   Final diagnoses:  Weakness  Cataracts, bilateral  Stroke  level 5 caveat for change in mental status. Patient from home with new onset confusion and difficulty moving the right side last seen normal at 10 AM. She is not anticoagulated.  Patient likely not TPA candidate due to recent stroke and improving symptoms.  CT without hemorrhage.  Acute/subacute R parietal and thalamic infarct.  Code stroke. Seen with Dr. Rip Harbour of teleneurology. He agrees she is not a TPA candidate. Due to recent CVA and improving symptoms. Stroke on CT today was seen on [facility] MRI 8/17.  (Patient alternative MRN [id number]). MRA performed at [facility] last month did not show any large vessel occlusions. Creatinine prohibits CTA today we'll repeat MRA.  No large vessel stenosis on MRA. MRI confirms new areas of infarct.Patient improving almost back to baseline per family. Admission d.w Dr. Jerilee Hoh.  CRITICAL CARE Performed by: Ezequiel Essex Total critical care time: 40 Critical care time was exclusive of separately billable procedures and treating other patients. Critical care was necessary to treat or prevent  imminent or life-threatening deterioration. Critical care was time spent personally by me on the following activities: development of treatment plan with patient and/or surrogate as well as nursing, discussions with consultants, evaluation of patient's response to treatment, examination of patient, obtaining history from patient or surrogate, ordering and performing treatments and interventions, ordering and review of laboratory studies, ordering and review of radiographic studies, pulse oximetry and re-evaluation of patient's condition.   I personally performed the services described in this documentation, which was scribed in my presence. The recorded information has been reviewed and is accurate.     Ezequiel Essex, MD 06/21/15 [phone]

## 2015-06-21 NOTE — H&P (Signed)
[facility] History and Physical  Berlin Viereck IRJ:[id number] DOB: 02/17/1919 DOA: 06/21/2015  Referring physician: Wyvonnia Dusky PCP: Wende Neighbors, MD   Chief Complaint: right arm weakness  HPI: Elizabeth Pierce is a delightful 81 y.o. female with past medical hx of afib, HTN, CKD III, Left hip fx s/p surgical repair, shingles to the emergency department from home via EMS with chief complaint of right arm weakness. Initial evaluation in the emergency department concerning for stroke and code stroke called. She was evaluated by teleneurology who opined she was not a TPA candidate due to her recent CVA and improving symptoms while in the emergency department. Recommended admission for stroke workup.  Information is obtained from the daughters at the bedside. She reports August 4 patient was at [facility] for routine visit with her ophthalmologist who noticed a facial droop and referred her to the emergency department which time it was determined she had a stroke. She was told at that time that imaging revealed she had had a previous stroke that went undiagnosed. Family reports this morning patient was able to eat breakfast take a bath and while walking with walker PT noted right arm weak quite suddenly. EMS was called. It is reported that time she had a facial droop. Patient denies headache visual disturbances numbness or tingling of her extremities. She denies any pain shortness of breath abdominal pain nausea vomiting. She denies fever chills dysuria hematuria frequency or urgency. She denies any difficulty chewing or swallowing.  Workup in the emergency department includes basic metabolic panel significant for potassium of 5.3, CBC significant for hemoglobin of 11.9, troponin 0.02. EKG with normal sinus rhythm, CT of the head reveals atrophy with periventricular small vessel disease. Areas concerning for recent/acute infarct in the medial parietal-occipital junction on the right as well as in the anterior  superior left thalamus. No hemorrhage or mass effect. 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  MRI reveals motion degraded exam demonstrating acute LEFT hemisphere infarction involving the posterior frontal, anterior parietal cortex and subcortical white matter. No acute hemorrhage.  Emergency department she is afebrile hemodynamically stable and not hypoxic at the time of my exam her symptoms are resolved. sHe passed the bedside swallow eval in the emergency department.     Review of Systems:  10 point review of systems completed and all systems are negative except as indicated in the history of present illness Past Medical History  Diagnosis Date  . Stroke   . Cataracts, bilateral    History reviewed. No pertinent past surgical history. Social History:  has no tobacco, alcohol, and drug history on file. Patient is retired lives alone with family very close by as well as daily caretakers who come into the home 16 hours a day. She uses a cane and/or walker for ambulation. Allergies  Allergen Reactions  . Tramadol     History reviewed. No pertinent family history. family medical history reviewed and noncontributory to the admission of this elderly lady  Prior to Admission medications   Medication Sig Start Date End Date Taking? Authorizing Provider  acetaminophen (TYLENOL) 325 MG tablet Take 650 mg by mouth every 6 (six) hours as needed.   Yes Historical Provider, MD  aspirin EC 81 MG tablet Take 81 mg by mouth daily.   Yes Historical Provider, MD  brimonidine-timolol (COMBIGAN) 0.2-0.5 % ophthalmic solution Place 1 drop into the left eye every 12 (twelve) hours.   Yes Historical Provider, MD  docusate sodium (COLACE) 100 MG capsule Take 100 mg  by mouth daily.   Yes Historical Provider, MD  Ferrous Sulfate Dried (SLOW RELEASE IRON) 45 MG TBCR Take 1 tablet by mouth daily.   Yes Historical Provider, MD  Multiple Vitamin (MULTI-VITAMIN DAILY PO) Take 1 tablet by mouth daily.   Yes  Historical Provider, MD  Multiple Vitamins-Minerals (PRESERVISION AREDS PO) Take 1 tablet by mouth daily.   Yes Historical Provider, MD  simethicone (MYLICON) 80 MG chewable tablet Chew 80 mg by mouth every 6 (six) hours as needed for flatulence.   Yes Historical Provider, MD  travoprost, benzalkonium, (TRAVATAN) 0.004 % ophthalmic solution Place 1 drop into the left eye at bedtime.   Yes Historical Provider, MD   Physical Exam: Filed Vitals:   06/21/15 1245 06/21/15 1341 06/21/15 1348 06/21/15 1400  BP: 153/71  169/79 166/55  Pulse: 57  59 56  Temp:  98.2 F (36.8 C)    TempSrc:      Resp: 21  19 16   Height:      Weight:      SpO2: 100%  100% 100%    Wt Readings from Last 3 Encounters:  06/21/15 65.772 kg (145 lb)    General:  Appears calm and comfortable, somewhat thin and frail Eyes: PERRL, normal lids, irises & conjunctiva ENT: grossly normal hearing, mucous membranes of her mouth are pink slightly dry tongue is midline Neck: no LAD, masses or thyromegaly Cardiovascular: RRR, no m/r/g. No LE edema. Pedal pulses present and palpable  Respiratory: CTA bilaterally, no w/r/r. Normal respiratory effort. Abdomen: soft, ntnd Skin: no rash or induration seen on limited exam Musculoskeletal: grossly normal tone BUE/BLE Psychiatric: grossly normal mood and affect, speech fluent and appropriate Neurologic: grossly non-focal.          Labs on Admission:  Basic Metabolic Panel:  Recent Labs Lab 06/21/15 1105 06/21/15 1114  NA 141 140  K 4.0 5.3*  CL 106 102  CO2 30  --   GLUCOSE 123* 128*  BUN 20 29*  CREATININE 1.43* 1.60*  CALCIUM 8.7*  --    Liver Function Tests:  Recent Labs Lab 06/21/15 1105  AST 22  ALT 12*  ALKPHOS 48  BILITOT 0.4  PROT 6.6  ALBUMIN 3.5   No results for input(s): LIPASE, AMYLASE in the last 168 hours. No results for input(s): AMMONIA in the last 168 hours. CBC:  Recent Labs Lab 06/21/15 1105 06/21/15 1114  WBC 5.7  --     NEUTROABS 3.5  --   HGB 10.9* 11.9*  HCT 34.5* 35.0*  MCV 89.6  --   PLT 232  --    Cardiac Enzymes: No results for input(s): CKTOTAL, CKMB, CKMBINDEX, TROPONINI in the last 168 hours.  BNP (last 3 results) No results for input(s): BNP in the last 8760 hours.  ProBNP (last 3 results) No results for input(s): PROBNP in the last 8760 hours.  CBG:  Recent Labs Lab 06/21/15 1116  GLUCAP 137*    Radiological Exams on Admission: Ct Head Wo Contrast  06/21/2015   CLINICAL DATA:  Right-sided weakness for 2 hours  EXAM: CT HEAD WITHOUT CONTRAST  TECHNIQUE: Contiguous axial images were obtained from the base of the skull through the vertex without intravenous contrast.  COMPARISON:  None.  FINDINGS: There is moderate diffuse atrophy. There is no intracranial mass, hemorrhage, acute appearing extra-axial fluid collection, or midline shift. There is decreased attenuation in the right medial parieto-occipital junction region, concerning for recent infarct. There is a small infarct in the  anterior superior left thalamus concerning for second focus of recent infarct. There is mild small vessel disease in the centra semiovale bilaterally. Elsewhere gray-white compartments appear normal. The bony calvarium appears intact. The mastoid air cells are clear. 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  IMPRESSION: Atrophy with periventricular small vessel disease. Areas concerning for recent/acute infarct in the medial parietal-occipital junction on the right as well as in the anterior superior left thalamus. No hemorrhage or mass effect.  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  Critical Value/emergent results were called by telephone at the time of interpretation on 06/21/2015 at 11:19 am to Dr. Ezequiel Essex , who verbally acknowledged these results.   Electronically Signed   By: Lowella Grip III M.D.   On: 06/21/2015 11:22    EKG: Independently reviewed. EKG with normal sinus  rhythm Assessment/Plan Principal Problem:   Stroke: acute per MRI as noted above. Hx afib and recent stroke on 81mg  aspirin. [facility]rology at [facility] advised that patient not candidate for anticoagulation.  Symptoms resolved. Will obtain carotid doppler. TTE done at [facility] 8[facility]11 notedTTE w/bubble studyEF 60%Pseudonormal filling patternNo shuntMildly dilated left atrium .will not repeat. Await HgA1c, cycle troponin check lipid panel. Will request PT/ot evaluation as well as consult neurology   Active Problems:   Hyperkalemia: mild. In setting of CKD III. Will provide kayexelate. Recheck. gentl IV fluids    Acute renal failure in setting of CKD III. Current creatinine close to baseline. Will hold nephrotoxins and monitor urine output. If no improvement consider renal US  A-fib: EKG with NSR. On aspirin 81mg . See #1.     Anemia: of chronic disease. Current Hg close to baseline. No s/sx bleeding. monitor    Dr Merlene Laughter  Code Status: DNR DVT Prophylaxis: Family Communication: daughter at bedside Disposition Plan: home hopefully  Time spent: 72 minutes  [facility] Pager [phone]

## 2015-06-21 NOTE — Plan of Care (Signed)
Contacted Dr. Concerning patient's atypical high BP.  184/38 and 177/49. Patient not on BP meds at home. CG showed BP list of readings from [facility] RN (usually runs 140s/80s or less).

## 2015-06-21 NOTE — Progress Notes (Signed)
RN CALLED CT 1055 [facility]PER 1103 ARRIVED IN CT 1108 CALLED GR 1112 [place] CALLED 1114

## 2015-06-21 NOTE — ED Notes (Signed)
Pt's family arrived and is at bedside. 

## 2015-06-21 NOTE — ED Notes (Signed)
Tele-neurology in process. 

## 2015-06-21 NOTE — ED Notes (Signed)
EDP full neuro exam prior to CT.

## 2015-06-21 NOTE — ED Notes (Signed)
Pt returned from MRI °

## 2015-06-21 NOTE — ED Notes (Addendum)
Unable to obtain VS/neuro checks until pt returns from MRI. Will re-check vitals/neuro upon patients return to room.

## 2015-06-21 NOTE — Consult Note (Addendum)
[place] A. Merlene Laughter, MD     [email or web address]          Elizabeth Pierce is an 81 y.o. female.   ASSESSMENT/PLAN: 1. Multiple bilateral infarcts in the setting of atrial fibrillation. The patient is clearly at increased risk of recurrent events to his atrial fibrillation. Consequent, I agree with long-term anticoagulation at the current doses. 2. Consider EEG given tonic flexion R hand. This can be done as outpatient basis. Follow-up in the office with me in 6 weeks. Pleasant and known acute distress.  The patient is a 81 year old white female who was diagnosed as having a stroke about a month ago while she was being evaluated by her ophthalmologist at [facility]. She is found to have what appears to be facial weakness and possibly visual field cut. Imaging showed an acute infarct but also showed remote infarct. The patient was diagnosed atrial fibrillation when she had a hip surgery if so weeks ago. It is unclear why she was not placed anticoagulation. She presented with weakness of the right hand although it appears that the most prominent feature seemed to be tonic flexion of the right hand worrisome for possible seizures although brain imaging shows evidence of acute infarct. It appears that the tonic flexure of the hand may last for a few minutes but the weakness symptoms persisted for a few hours. The patient has significant hearing impairment especially on the left side. She had a cataract procedure which went bad requiring multiple surgery and she had the right I'm removed she denies prosthesis. No other problems are reported. She is relatively cognitively intact and demonstrated well at baseline per the daughter's. The review of system is otherwise negative.  GENERAL: This is a pleasant female in no acute distress.  HEENT: Supple. Atraumatic normocephalic. Marked hearing impairment especially on the left side.  ABDOMEN: soft  EXTREMITIES: No edema   BACK:  Normal.  SKIN: Normal by inspection.    MENTAL STATUS: Alert and oriented. Speech, language and cognition are generally intact. Judgment and insight normal.   CRANIAL NERVES: Left pupil reactive to light; extra ocular movements are full, there is no significant nystagmus; visual fields seem to show peripheral loss involving the left eye; upper and lower facial muscles are normal in strength and symmetric, there is no flattening of the nasolabial folds; tongue is midline; uvula is midline; shoulder elevation is normal.  MOTOR: Normal tone, bulk and strength; no pronator drift.  COORDINATION: Left finger to nose is normal, right finger to nose is normal, No rest tremor; no intention tremor; no postural tremor; no bradykinesia.  REFLEXES: Deep tendon reflexes are symmetrical and normal. Babinski reflexes are flexor bilaterally.   SENSATION: Normal to light touch.   Blood pressure 177/49, pulse 53, temperature 98.3 F (36.8 C), temperature source Oral, resp. rate 18, height 5' 1"  (1.549 m), weight 50.894 kg (112 lb 3.2 oz), SpO2 100 %.  Past Medical History  Diagnosis Date  . Stroke   . Cataracts, bilateral     History reviewed. No pertinent past surgical history.  History reviewed. No pertinent family history.  Social History:  reports that she has never smoked. Her smokeless tobacco use includes Snuff. Her alcohol and drug histories are not on file.  Allergies:  Allergies  Allergen Reactions  . Tramadol     Medications: Prior to Admission medications   Medication Sig Start Date End Date Taking? Authorizing Provider  acetaminophen (TYLENOL) 325 MG tablet Take 650 mg by mouth every 6 (  six) hours as needed.   Yes Historical Provider, MD  aspirin EC 81 MG tablet Take 81 mg by mouth daily.   Yes Historical Provider, MD  brimonidine-timolol (COMBIGAN) 0.2-0.5 % ophthalmic solution Place 1 drop into the left eye every 12 (twelve) hours.   Yes Historical Provider, MD  docusate  sodium (COLACE) 100 MG capsule Take 100 mg by mouth daily.   Yes Historical Provider, MD  Ferrous Sulfate Dried (SLOW RELEASE IRON) 45 MG TBCR Take 1 tablet by mouth daily.   Yes Historical Provider, MD  Multiple Vitamin (MULTI-VITAMIN DAILY PO) Take 1 tablet by mouth daily.   Yes Historical Provider, MD  Multiple Vitamins-Minerals (PRESERVISION AREDS PO) Take 1 tablet by mouth daily.   Yes Historical Provider, MD  simethicone (MYLICON) 80 MG chewable tablet Chew 80 mg by mouth every 6 (six) hours as needed for flatulence.   Yes Historical Provider, MD  travoprost, benzalkonium, (TRAVATAN) 0.004 % ophthalmic solution Place 1 drop into the left eye at bedtime.   Yes Historical Provider, MD    Scheduled Meds: . apixaban  2.5 mg Oral BID  . brimonidine  1 drop Left Eye Q12H   And  . timolol  1 drop Left Eye Q12H  . docusate sodium  100 mg Oral Daily  . [START ON 06/22/2015] ferrous sulfate  325 mg Oral Daily  . [START ON 06/22/2015] Influenza vac split quadrivalent PF  0.5 mL Intramuscular Tomorrow-1000  . latanoprost  1 drop Left Eye QHS  . [START ON 06/22/2015] pneumococcal 23 valent vaccine  0.5 mL Intramuscular Tomorrow-1000   Continuous Infusions: . sodium chloride 50 mL/hr at 06/21/15 1709   PRN Meds:.acetaminophen, senna-docusate, simethicone     Results for orders placed or performed during the hospital encounter of 06/21/15 (from the past 48 hour(s))  Ethanol     Status: Abnormal   Collection Time: 06/21/15 11:04 AM  Result Value Ref Range   Alcohol, Ethyl (B) 6 (H) <5 mg/dL    Comment:        LOWEST DETECTABLE LIMIT FOR SERUM ALCOHOL IS 5 mg/dL FOR MEDICAL PURPOSES ONLY   Protime-INR     Status: None   Collection Time: 06/21/15 11:05 AM  Result Value Ref Range   Prothrombin Time 14.2 11.6 - 15.2 seconds   INR 1.08 0.00 - 1.49  APTT     Status: None   Collection Time: 06/21/15 11:05 AM  Result Value Ref Range   aPTT 26 24 - 37 seconds  CBC     Status: Abnormal    Collection Time: 06/21/15 11:05 AM  Result Value Ref Range   WBC 5.7 4.0 - 10.5 K/uL   RBC 3.85 (L) 3.87 - 5.11 MIL/uL   Hemoglobin 10.9 (L) 12.0 - 15.0 g/dL   HCT 34.5 (L) 36.0 - 46.0 %   MCV 89.6 78.0 - 100.0 fL   MCH 28.3 26.0 - 34.0 pg   MCHC 31.6 30.0 - 36.0 g/dL   RDW 14.4 11.5 - 15.5 %   Platelets 232 150 - 400 K/uL  Differential     Status: None   Collection Time: 06/21/15 11:05 AM  Result Value Ref Range   Neutrophils Relative % 62 %   Neutro Abs 3.5 1.7 - 7.7 K/uL   Lymphocytes Relative 21 %   Lymphs Abs 1.2 0.7 - 4.0 K/uL   Monocytes Relative 11 %   Monocytes Absolute 0.6 0.1 - 1.0 K/uL   Eosinophils Relative 5 %   Eosinophils Absolute  0.3 0.0 - 0.7 K/uL   Basophils Relative 1 %   Basophils Absolute 0.1 0.0 - 0.1 K/uL  Comprehensive metabolic panel     Status: Abnormal   Collection Time: 06/21/15 11:05 AM  Result Value Ref Range   Sodium 141 135 - 145 mmol/L   Potassium 4.0 3.5 - 5.1 mmol/L   Chloride 106 101 - 111 mmol/L   CO2 30 22 - 32 mmol/L   Glucose, Bld 123 (H) 65 - 99 mg/dL   BUN 20 6 - 20 mg/dL   Creatinine, Ser 1.43 (H) 0.44 - 1.00 mg/dL   Calcium 8.7 (L) 8.9 - 10.3 mg/dL   Total Protein 6.6 6.5 - 8.1 g/dL   Albumin 3.5 3.5 - 5.0 g/dL   AST 22 15 - 41 U/L   ALT 12 (L) 14 - 54 U/L   Alkaline Phosphatase 48 38 - 126 U/L   Total Bilirubin 0.4 0.3 - 1.2 mg/dL   GFR calc non Af Amer 30 (L) >60 mL/min   GFR calc Af Amer 35 (L) >60 mL/min    Comment: (NOTE) The eGFR has been calculated using the CKD EPI equation. This calculation has not been validated in all clinical situations. eGFR's persistently <60 mL/min signify possible Chronic Kidney Disease.    Anion gap 5 5 - 15  Lipid panel     Status: Abnormal   Collection Time: 06/21/15 11:05 AM  Result Value Ref Range   Cholesterol 162 0 - 200 mg/dL   Triglycerides 187 (H) <150 mg/dL   HDL 45 >40 mg/dL   Total CHOL/HDL Ratio 3.6 RATIO   VLDL 37 0 - 40 mg/dL   LDL Cholesterol 80 0 - 99 mg/dL     Comment:        Total Cholesterol/HDL:CHD Risk Coronary Heart Disease Risk Table                     Men   Women  1/2 Average Risk   3.4   3.3  Average Risk       5.0   4.4  2 X Average Risk   9.6   7.1  3 X Average Risk  23.4   11.0        Use the calculated Patient Ratio above and the CHD Risk Table to determine the patient's CHD Risk.        ATP III CLASSIFICATION (LDL):  <100     mg/dL   Optimal  100-129  mg/dL   Near or Above                    Optimal  130-159  mg/dL   Borderline  160-189  mg/dL   High  >190     mg/dL   Very High   I-stat troponin, ED (not at [facility], [facility])     Status: None   Collection Time: 06/21/15 11:12 AM  Result Value Ref Range   Troponin i, poc 0.02 0.00 - 0.08 ng/mL   Comment 3            Comment: Due to the release kinetics of cTnI, a negative result within the first hours of the onset of symptoms does not rule out myocardial infarction with certainty. If myocardial infarction is still suspected, repeat the test at appropriate intervals.   I-Stat Chem 8, ED  (not at [facility], [facility])     Status: Abnormal   Collection Time: 06/21/15 11:14 AM  Result Value Ref  Range   Sodium 140 135 - 145 mmol/L   Potassium 5.3 (H) 3.5 - 5.1 mmol/L   Chloride 102 101 - 111 mmol/L   BUN 29 (H) 6 - 20 mg/dL   Creatinine, Ser 1.60 (H) 0.44 - 1.00 mg/dL   Glucose, Bld 128 (H) 65 - 99 mg/dL   Calcium, Ion 1.23 1.13 - 1.30 mmol/L   TCO2 30 0 - 100 mmol/L   Hemoglobin 11.9 (L) 12.0 - 15.0 g/dL   HCT 35.0 (L) 36.0 - 46.0 %  CBG monitoring, ED     Status: Abnormal   Collection Time: 06/21/15 11:16 AM  Result Value Ref Range   Glucose-Capillary 137 (H) 65 - 99 mg/dL  Urine rapid drug screen (hosp performed)not at [facility]     Status: None   Collection Time: 06/21/15 12:41 PM  Result Value Ref Range   Opiates NONE DETECTED NONE DETECTED   Cocaine NONE DETECTED NONE DETECTED   Benzodiazepines NONE DETECTED NONE DETECTED   Amphetamines NONE DETECTED NONE DETECTED    Tetrahydrocannabinol NONE DETECTED NONE DETECTED   Barbiturates NONE DETECTED NONE DETECTED    Comment:        DRUG SCREEN FOR MEDICAL PURPOSES ONLY.  IF CONFIRMATION IS NEEDED FOR ANY PURPOSE, NOTIFY LAB WITHIN 5 DAYS.        LOWEST DETECTABLE LIMITS FOR URINE DRUG SCREEN Drug Class       Cutoff (ng/mL) Amphetamine      1000 Barbiturate      200 Benzodiazepine   850 Tricyclics       277 Opiates          300 Cocaine          300 THC              50   Urinalysis, Routine w reflex microscopic (not at [facility])     Status: None   Collection Time: 06/21/15 12:42 PM  Result Value Ref Range   Color, Urine YELLOW YELLOW   APPearance CLEAR CLEAR   Specific Gravity, Urine 1.015 1.005 - 1.030   pH 6.0 5.0 - 8.0   Glucose, UA NEGATIVE NEGATIVE mg/dL   Hgb urine dipstick NEGATIVE NEGATIVE   Bilirubin Urine NEGATIVE NEGATIVE   Ketones, ur NEGATIVE NEGATIVE mg/dL   Protein, ur NEGATIVE NEGATIVE mg/dL   Urobilinogen, UA 0.2 0.0 - 1.0 mg/dL   Nitrite NEGATIVE NEGATIVE   Leukocytes, UA NEGATIVE NEGATIVE    Comment: MICROSCOPIC NOT DONE ON URINES WITH NEGATIVE PROTEIN, BLOOD, LEUKOCYTES, NITRITE, OR GLUCOSE <1000 mg/dL.    Studies/Results:  The patient's brain MRI is reviewed in person. There is acute infarct seen in the left parietal region with increased signal on diffusion and corresponding reduced signal on the ADC scan. There is also increasing until seen in the right occipital region without corresponding findings on the ADC scan. There is encephalomalacia seen adjacent to the's right occipital area all suggestive of chronic infarct.   Kofi A. Merlene Laughter, M.D.  Diplomate, Tax adviser of Psychiatry and Neurology ( Neurology). 06/21/2015, 6:47 PM

## 2015-06-22 ENCOUNTER — Other Ambulatory Visit (HOSPITAL_COMMUNITY): Payer: Medicare Other

## 2015-06-22 DIAGNOSIS — I639 Cerebral infarction, unspecified: Secondary | ICD-10-CM | POA: Diagnosis not present

## 2015-06-22 DIAGNOSIS — I480 Paroxysmal atrial fibrillation: Secondary | ICD-10-CM | POA: Diagnosis not present

## 2015-06-22 LAB — HEMOGLOBIN A1C
HEMOGLOBIN A1C: 6.1 % — AB (ref 4.8–5.6)
MEAN PLASMA GLUCOSE: 128 mg/dL

## 2015-06-22 LAB — TROPONIN I: Troponin I: <0.03 ng/mL (ref <0.031)

## 2015-06-22 MED ORDER — ATORVASTATIN CALCIUM 10 MG PO TABS: 10.0000 mg | ORAL_TABLET | Freq: Every day | ORAL | Status: DC

## 2015-06-22 MED ORDER — APIXABAN 2.5 MG PO TABS: 2.5000 mg | ORAL_TABLET | Freq: Two times a day (BID) | ORAL | Status: AC

## 2015-06-22 NOTE — Care Management Note (Signed)
Case Management Note  Patient Details  Name: Elizabeth Pierce MRN: [id number] Date of Birth: Dec 08, 1918  Expected Discharge Date:     06/22/2015             Expected Discharge Plan:  [place]  In-House Referral:  NA  Discharge planning Services  CM Consult  Post Acute Care Choice:  Home Health Choice offered to:  Patient  DME Arranged:    DME Agency:     HH Arranged:  RN, PT [place] Agency:  [place]  Status of Service:  Completed, signed off  Medicare Important Message Given:    Date Medicare IM Given:    Medicare IM give by:    Date Additional Medicare IM Given:    Additional Medicare Important Message give by:     If discussed at [place] of Stay Meetings, dates discussed:    Additional Comments: Pt is from home, lives alone but has 24/7 supervision by family and PD aids. Pt was recently DC's from [facility] services after having a CVA. Pt admitted with CVA this admission but has no acute deficits at this time but does feel as though she is not back at baseline. Pt has walker at home for PRN use. Pt will DC home with [facility] RN/PT. Romualdo Bolk, of [facility], per pt's choice, made aware of referral and will obtain pt info from chart. Family made aware that [facility] has 48 hours to make first visit. No further CM needs noted.  Sherald Barge, RN 06/22/2015, 1:29 PM

## 2015-06-22 NOTE — Clinical Social Work Note (Signed)
Pt referred by RN as family requesting information on Meals on Wheels. CSW provided contact information for family to get in touch with [facility] of [facility]. Daughter appreciative. Will sign off, but can be reconsulted if needed.  Benay Pike, [address]

## 2015-06-22 NOTE — Evaluation (Signed)
Physical Therapy Evaluation Patient Details Name: Elizabeth Pierce MRN: [id number] DOB: 05-29-1919 Today's Date: 06/22/2015   History of Present Illness  Pt is a delightful 81 y.o. female with past medical hx of afib, HTN, CKD III, Left hip fx s/p surgical repair, shingles to the emergency department from home via EMS with chief complaint of right arm weakness. Initial evaluation in the emergency department concerning for stroke and code stroke called. She was evaluated by teleneurology who opined she was not a TPA candidate due to her recent CVA and improving symptoms while in the emergency department. Recommended admission for stroke workup.  Clinical Impression   Pt was seen for evaluation and she was found to be at prior functional level.  She has good support at home and should be able to transition back there with no additional PT (PT had just recently been d/c'd from home care).    Follow Up Recommendations No PT follow up    Equipment Recommendations  None recommended by PT    Recommendations for Other Services   none    Precautions / Restrictions Precautions Precautions: Fall Restrictions Weight Bearing Restrictions: No      Mobility  Bed Mobility Overal bed mobility: Independent             General bed mobility comments: Pt required cuing due to slight confusion with instructions  Transfers Overall transfer level: Needs assistance Equipment used: Rolling walker (2 wheeled) Transfers: Sit to/from Stand Sit to Stand: Supervision            Ambulation/Gait Ambulation/Gait assistance: Min guard Ambulation Distance (Feet): 150 Feet Assistive device: Rolling walker (2 wheeled) Gait Pattern/deviations: WFL(Within Functional Limits) Gait velocity: appropriate for situation   General Gait Details: 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  Stairs            Wheelchair Mobility    Modified Rankin (Stroke Patients Only)       Balance Overall balance assessment: No apparent balance deficits (not formally assessed)                                           Pertinent Vitals/Pain Pain Assessment: No/denies pain    Home Living Family/patient expects to be discharged to:: Private residence Living Arrangements: Other (Comment) Available Help at Discharge: Family;Personal care attendant;Available 24 hours/day Type of Home: Apartment       Home Layout: One level Home Equipment: Walker - 2 wheels;Cane - single point      Prior Function Level of Independence: Needs assistance   Gait / Transfers Assistance Needed: Pt used 2 wheeled walker for ambulation  ADL's / Homemaking Assistance Needed: Pt required assistance in transferring into and out of tub, meal preparation, medication management        Hand Dominance   Dominant Hand: Right    Extremity/Trunk Assessment   Upper Extremity Assessment: RUE deficits/detail RUE Deficits / Details: Minimal weakness in RUE 4/5         Lower Extremity Assessment: Overall WFL for tasks assessed      Cervical / Trunk Assessment: Kyphotic  Communication   Communication: HOH  Cognition Arousal/Alertness: Awake/alert  Behavior During Therapy: WFL for tasks assessed/performed Overall Cognitive Status: Within Functional Limits for tasks assessed                      General Comments      Exercises        Assessment/Plan    PT Assessment Patent does not need any further PT services  PT Diagnosis     PT Problem List    PT Treatment Interventions     PT Goals (Current goals can be found in the Care Plan section) Acute Rehab PT Goals PT Goal Formulation: All assessment and education complete, DC therapy    Frequency      Barriers to discharge        Co-evaluation               End of Session Equipment Utilized During Treatment: Gait belt Activity Tolerance: Patient tolerated treatment well Patient left: in chair;with call bell/phone within reach;with family/visitor present Nurse Communication: Mobility status    Functional Assessment Tool Used: clinical judgement Functional Limitation: Mobility: Walking and moving around Mobility: Walking and Moving Around Current Status (S2395): At least 1 percent but less than 20 percent impaired, limited or restricted Mobility: Walking and Moving Around Goal Status [phone]): At least 1 percent but less than 20 percent impaired, limited or restricted Mobility: Walking and Moving Around Discharge Status [phone]): At least 1 percent but less than 20 percent impaired, limited or restricted    Time: 0912-0939 PT Time Calculation (min) (ACUTE ONLY): 27 min   Charges:   PT Evaluation $Initial PT Evaluation Tier I: 1 Procedure     PT G Codes:   PT G-Codes **NOT FOR INPATIENT CLASS** Functional Assessment Tool Used: clinical judgement Functional Limitation: Mobility: Walking and moving around Mobility: Walking and Moving Around Current Status (Y6168): At least 1 percent but less than 20 percent impaired, limited or restricted Mobility: Walking and Moving Around Goal Status [phone]): At least 1 percent but less than 20 percent impaired, limited or restricted Mobility: Walking and Moving Around Discharge Status [phone]): At least 1 percent but less than 20 percent impaired, limited or restricted    Sable Feil  PT 06/22/2015, 11:02 AM [phone]

## 2015-06-22 NOTE — Evaluation (Signed)
Occupational Therapy Evaluation Patient Details Name: Elizabeth Pierce MRN: [id number] DOB: 22-Sep-1919 Today's Date: 06/22/2015    History of Present Illness Pt is a delightful 81 y.o. female with past medical hx of afib, HTN, CKD III, Left hip fx s/p surgical repair, shingles to the emergency department from home via EMS with chief complaint of right arm weakness. Initial evaluation in the emergency department concerning for stroke and code stroke called. She was evaluated by teleneurology who opined she was not a TPA candidate due to her recent CVA and improving symptoms while in the emergency department. Recommended admission for stroke workup.   Clinical Impression   Pt awake, alert, and eating breakfast this morning. OT noted lack of coordination when pt tried to use right hand during breakfast tasks. Daughter present for evaluation. Pt has an aide who comes for several hours each day, when aide not present daughters take turns staying with pt. Upon discharge pt will have 24/7 supervision. Pt requires cuing to compensate for visual deficits during ADL and functional mobility tasks. 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 Pt required min guard and verbal cuing for moving from bed to chair this am. Pt demonstrates BUE range of motion WNL, strength WFL. Pt demonstrates lack of coordination in right hand, daughter reports right hand has improved greatly since arrival. Recommend outpatient OT services if coordination does not improve. No further acute OT services required at this time.     Follow Up Recommendations  Supervision/Assistance - 24 hour;Other (comment) (outpatient OT services if coordination does not improve)    Equipment Recommendations  None recommended by OT       Precautions / Restrictions Precautions Precautions: Fall Restrictions Weight Bearing Restrictions: No      Mobility Bed Mobility Overal bed mobility: Independent              General bed mobility comments: Pt required cuing due to slight confusion with instructions  Transfers Overall transfer level: Needs assistance Equipment used: Rolling walker (2 wheeled) Transfers: Sit to/from Stand Sit to Stand: Supervision                   ADL Overall ADL's : Needs assistance/impaired;At baseline Eating/Feeding: Set up;Cueing for compensatory techinques (visual deficits)                   Lower Body Dressing: Supervision/safety                 General ADL Comments: Pt able to complete ADL tasks with some difficulty due to visual deficits and lack of coordination of right hand/digits               Pertinent Vitals/Pain Pain Assessment: No/denies pain     Hand Dominance Right   Extremity/Trunk Assessment Upper Extremity Assessment Upper Extremity Assessment: RUE deficits/detail RUE Deficits / Details: Minimal weakness in RUE 4/5 RUE Coordination: decreased fine motor   Lower Extremity Assessment Lower Extremity Assessment: Defer to PT evaluation       Communication Communication Communication: HOH   Cognition Arousal/Alertness: Awake/alert Behavior During Therapy: WFL for tasks assessed/performed Overall Cognitive Status: Within Functional Limits for tasks assessed                                Home Living Family/patient expects to be discharged to:: Private residence Living Arrangements: Other (Comment) (has support 24/7) Available Help  at Discharge: Family;Personal care attendant;Available 24 hours/day Type of Home: Apartment             Bathroom Shower/Tub: Teacher, early years/pre: Standard     Home Equipment: Environmental consultant - 2 wheels;Cane - single point          Prior Functioning/Environment Level of Independence: Needs assistance  Gait / Transfers Assistance Needed: Pt used 2 wheeled walker for ambulation ADL's / Homemaking Assistance Needed: Pt required assistance in transferring into  and out of tub, meal preparation, medication management        OT Diagnosis: Generalized weakness;Other (comment) (lack of coordination)   OT Problem List: Decreased coordination;Impaired UE functional use    End of Session Equipment Utilized During Treatment: Gait belt;Rolling walker  Activity Tolerance: Patient tolerated treatment well Patient left: in chair;with call bell/phone within reach;with family/visitor present   Time: 0812-0851 OT Time Calculation (min): 39 min Charges:  OT General Charges $OT Visit: 1 Procedure OT Evaluation $Initial OT Evaluation Tier I: 1 Procedure G-Codes: OT G-codes **NOT FOR INPATIENT CLASS** Functional Assessment Tool Used: clinical judgement Functional Limitation: Self care Self Care Current Status (W5462): At least 40 percent but less than 60 percent impaired, limited or restricted Self Care Goal Status (V0350): At least 40 percent but less than 60 percent impaired, limited or restricted Self Care Discharge Status [phone]): At least 40 percent but less than 60 percent impaired, limited or restricted   Guadelupe Sabin, OTR/L  [phone]  06/22/2015, 9:22 AM

## 2015-06-22 NOTE — Discharge Summary (Signed)
Physician Discharge Summary  Elizabeth Pierce WGY:[id number] DOB: 1919/01/12 DOA: 06/21/2015  PCP: Wende Neighbors, MD  Admit date: 06/21/2015 Discharge date: 06/22/2015  Time spent: 40 minutes  Recommendations for Outpatient Follow-up:  1. Follow up with Dr Merlene Laughter 6 weeks for evaluation of stroke   Discharge Diagnoses:  Principal Problem:   Stroke Active Problems:   Hyperkalemia   Acute renal failure   A-fib   Anemia   Discharge Condition: stable  Diet recommendation: heart healthy  Filed Weights   06/21/15 1118 06/21/15 1600 06/21/15 1705  Weight: 65.772 kg (145 lb) 50.894 kg (112 lb 3.2 oz) 50.894 kg (112 lb 3.2 oz)    History of present illness:  Elizabeth Pierce is a delightful 81 y.o. female with past medical hx of afib, HTN, CKD III, Left hip fx s/p surgical repair, shingles presents to the emergency department from home via EMS on 06/21/15 with chief complaint of right arm weakness. Initial evaluation in the emergency department concerning for stroke and code stroke called. She was evaluated by teleneurology who opined she was not a TPA candidate due to her recent CVA and improving symptoms while in the emergency department. Recommended admission for stroke workup.  Information  obtained from the daughter at the bedside. She reported August 4 patient was at [facility] for routine visit with her ophthalmologist who noticed a facial droop and referred her to the emergency department at which time it was determined she had a stroke. She was told at that time that imaging revealed she had had a previous stroke that went undiagnosed. Family reported the morning of presentation patient was able to eat breakfast take a bath and while walking with walker PT noted right arm weak quite suddenly. EMS was called. It is reported that time she had a facial droop.  Patient denied headache visual disturbances numbness or tingling of her extremities. She denied any pain shortness of breath abdominal pain  nausea vomiting. She denied fever chills dysuria hematuria frequency or urgency. She denied any difficulty chewing or swallowing.  Workup in the emergency department included basic metabolic panel significant for potassium of 5.3, CBC significant for hemoglobin of 11.9, troponin 0.02. EKG with normal sinus rhythm, CT of the head revealed atrophy with periventricular small vessel disease. Areas concerning for recent/acute infarct in the medial parietal-occipital junction on the right as well as in the anterior superior left thalamus. No hemorrhage or mass effect. 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 MRI revealed motion degraded exam demonstrating acute LEFT hemisphere infarction involving the posterior frontal, anterior parietal cortex and subcortical white matter. No acute hemorrhage.  Emergency department she was afebrile hemodynamically stable and not hypoxic. Her symptoms resolved in ED. sHe passed the bedside swallow eval in the emergency department.  Hospital Course:  Stroke: acute per MRI as noted above. Hx afib and recent stroke on 81mg  aspirin. carotid doppler with Mild atherosclerotic disease in the carotid arteries without significant stenosis. Estimated degree of stenosis in the internal carotid arteries is less than 50% bilaterally. TTE done at [facility] 8/11 notedTTE w/bubble studyEF 60%Pseudonormal filling patternNo shuntMildly dilated left atrium  HgA1c 6.1, troponin  negative x3, lipid panel with triglycerides 187 otherwise unremarkable. Evaluated by  PT/ot who opine no needs.  Evaluated by neurology who recommends Eliquis at low dose started. Follow up with Dr Merlene Laughter in 6 weeks. Start statin   Active Problems:  Hyperkalemia: mild. In setting of CKD III. Resolved at discharge   Acute renal failure in setting of  CKD III. stable  A-fib: EKG with NSR.  See #1.    Anemia: stable   Procedures: none Consultations:  neurology  Discharge Exam: Filed Vitals:    06/22/15 1000  BP: 186/60  Pulse: 65  Temp: 98.3 F (36.8 C)  Resp: 18    General: well nourished appears comfortable Cardiovascular: rrr no MGR no LE edema Respiratory: normal effort BS clear bilaterally  Discharge Instructions   Discharge Instructions    Diet - low sodium heart healthy    Complete by:  As directed      Discharge instructions    Complete by:  As directed   Take medication as directed Follow up with Dr Merlene Laughter 6 weeks     Increase activity slowly    Complete by:  As directed           Current Discharge Medication List    START taking these medications   Details  apixaban (ELIQUIS) 2.5 MG TABS tablet Take 1 tablet (2.5 mg total) by mouth 2 (two) times daily. Qty: 60 tablet, Refills: 1    atorvastatin (LIPITOR) 10 MG tablet Take 1 tablet (10 mg total) by mouth daily at 6 PM. Qty: 30 tablet, Refills: 0      CONTINUE these medications which have NOT CHANGED   Details  acetaminophen (TYLENOL) 325 MG tablet Take 650 mg by mouth every 6 (six) hours as needed.    brimonidine-timolol (COMBIGAN) 0.2-0.5 % ophthalmic solution Place 1 drop into the left eye every 12 (twelve) hours.    docusate sodium (COLACE) 100 MG capsule Take 100 mg by mouth daily.    Ferrous Sulfate Dried (SLOW RELEASE IRON) 45 MG TBCR Take 1 tablet by mouth daily.    Multiple Vitamin (MULTI-VITAMIN DAILY PO) Take 1 tablet by mouth daily.    Multiple Vitamins-Minerals (PRESERVISION AREDS PO) Take 1 tablet by mouth daily.    simethicone (MYLICON) 80 MG chewable tablet Chew 80 mg by mouth every 6 (six) hours as needed for flatulence.    travoprost, benzalkonium, (TRAVATAN) 0.004 % ophthalmic solution Place 1 drop into the left eye at bedtime.      STOP taking these medications     aspirin EC 81 MG tablet        Allergies  Allergen Reactions  . Tramadol    Follow-up Information    Follow up with Phillips Odor, MD On 08/03/2015.   Specialty:  Neurology   Why:  at 12:15 pm    Contact information:   [address] [place] [address] [phone]        The results of significant diagnostics from this hospitalization (including imaging, microbiology, ancillary and laboratory) are listed below for reference.    Significant Diagnostic Studies: Ct Head Wo Contrast  06/21/2015   CLINICAL DATA:  Right-sided weakness for 2 hours  EXAM: CT HEAD WITHOUT CONTRAST  TECHNIQUE: Contiguous axial images were obtained from the base of the skull through the vertex without intravenous contrast.  COMPARISON:  None.  FINDINGS: There is moderate diffuse atrophy. There is no intracranial mass, hemorrhage, acute appearing extra-axial fluid collection, or midline shift. There is decreased attenuation in the right medial parieto-occipital junction region, concerning for recent infarct. There is a small infarct in the anterior superior left thalamus concerning for second focus of recent infarct. There is mild small vessel disease in the centra semiovale bilaterally. Elsewhere gray-white compartments appear normal. The bony calvarium appears intact. The mastoid air cells are clear. There is a prosthetic  globe on the right.  IMPRESSION: Atrophy with periventricular small vessel disease. Areas concerning for recent/acute infarct in the medial parietal-occipital junction on the right as well as in the anterior superior left thalamus. No hemorrhage or mass effect.  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  Critical Value/emergent results were called by telephone at the time of interpretation on 06/21/2015 at 11:19 am to Dr. Ezequiel Essex , who verbally acknowledged these results.   Electronically Signed   By: Lowella Grip III M.D.   On: 06/21/2015 11:22   Mr Jodene Nam Head Wo Contrast  06/21/2015   CLINICAL DATA:  Altered mental status and RIGHT-sided weakness for 1 day.  EXAM: MRI HEAD WITHOUT CONTRAST  MRA HEAD WITHOUT CONTRAST  TECHNIQUE: Multiplanar, multiecho pulse sequences of the brain  and surrounding structures were obtained without intravenous contrast. Angiographic images of the head were obtained using MRA technique without contrast.  COMPARISON:  CT head earlier today.  FINDINGS: MRI HEAD FINDINGS  The patient was unable to remain motionless for the exam. Small or subtle lesions could be overlooked.  Multifocal areas of acute infarction affect the LEFT hemisphere primarily, LEFT posterior frontal and anterior parietal cortex and subcortical white matter. No acute hemorrhage.  No mass lesion, hydrocephalus, or extra-axial. Suspected laminar necrosis associated with an old RIGHT PCA territory infarction, although difficult to to exclude completely small associated areas of additional RIGHT PCA territory acute infarction.  Generalized atrophy. Chronic microvascular ischemic change. Marked pannus surrounding C2 mildly effaces the cervicomedullary junction without definite cord compression. Large remote LEFT thalamic lacunar infarct. 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 LEFT cataract extraction. No acute sinus or mastoid disease.  MRA HEAD FINDINGS  Of misregistration due to motion. No gross carotid or basilar stenosis. Vertebrals contribute to basilar formation, LEFT dominant. Mild irregularity of the proximal anterior, middle, and posterior cerebral arteries without apparent flow limiting stenosis. Moderately disease T2 and T3 MCA segments bilaterally, without definite proximal branch occlusion. Both distal PCAs are patent. Difficult to assess for cerebellar branch occlusion.  IMPRESSION: Motion degraded exam demonstrating acute LEFT hemisphere infarction involving the posterior frontal, anterior parietal cortex and subcortical white matter. No acute hemorrhage.  Chronic RIGHT PCA infarct with laminar necrosis.  Atrophy and small vessel disease.  Within limits of detection due to motion, no proximal large vessel occlusion on MRA. Intracranial atherosclerotic change as described.   Electronically  Signed   By: Staci Righter M.D.   On: 06/21/2015 14:19   Mr Brain Wo Contrast  06/21/2015   CLINICAL DATA:  Altered mental status and RIGHT-sided weakness for 1 day.  EXAM: MRI HEAD WITHOUT CONTRAST  MRA HEAD WITHOUT CONTRAST  TECHNIQUE: Multiplanar, multiecho pulse sequences of the brain and surrounding structures were obtained without intravenous contrast. Angiographic images of the head were obtained using MRA technique without contrast.  COMPARISON:  CT head earlier today.  FINDINGS: MRI HEAD FINDINGS  The patient was unable to remain motionless for the exam. Small or subtle lesions could be overlooked.  Multifocal areas of acute infarction affect the LEFT hemisphere primarily, LEFT posterior frontal and anterior parietal cortex and subcortical white matter. No acute hemorrhage.  No mass lesion, hydrocephalus, or extra-axial. Suspected laminar necrosis associated with an old RIGHT PCA territory infarction, although difficult to to exclude completely small associated areas of additional RIGHT PCA territory acute infarction.  Generalized atrophy. Chronic microvascular ischemic change. Marked pannus surrounding C2 mildly effaces the cervicomedullary junction without definite cord compression. Large remote LEFT thalamic  lacunar infarct. 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 LEFT cataract extraction. No acute sinus or mastoid disease.  MRA HEAD FINDINGS  Of misregistration due to motion. No gross carotid or basilar stenosis. Vertebrals contribute to basilar formation, LEFT dominant. Mild irregularity of the proximal anterior, middle, and posterior cerebral arteries without apparent flow limiting stenosis. Moderately disease T2 and T3 MCA segments bilaterally, without definite proximal branch occlusion. Both distal PCAs are patent. Difficult to assess for cerebellar branch occlusion.  IMPRESSION: Motion degraded exam demonstrating acute LEFT hemisphere infarction involving the posterior frontal, anterior parietal cortex  and subcortical white matter. No acute hemorrhage.  Chronic RIGHT PCA infarct with laminar necrosis.  Atrophy and small vessel disease.  Within limits of detection due to motion, no proximal large vessel occlusion on MRA. Intracranial atherosclerotic change as described.   Electronically Signed   By: Staci Righter M.D.   On: 06/21/2015 14:19   US Carotid Bilateral  06/21/2015   CLINICAL DATA:  Right-sided weakness and stroke.  EXAM: BILATERAL CAROTID DUPLEX ULTRASOUND  TECHNIQUE: Pearline Cables scale imaging, color Doppler and duplex ultrasound were performed of bilateral carotid and vertebral arteries in the neck.  COMPARISON:  MRI 06/21/2015  FINDINGS: Criteria: Quantification of carotid stenosis is based on velocity parameters that correlate the residual internal carotid diameter with NASCET-based stenosis levels, using the diameter of the distal internal carotid lumen as the denominator for stenosis measurement.  The following velocity measurements were obtained:  RIGHT  ICA:  88 cm/sec  CCA:  54 cm/sec  SYSTOLIC ICA/CCA RATIO:  1.6  DIASTOLIC ICA/CCA RATIO:  1.7  ECA:  69 cm/sec  LEFT  ICA:  74 cm/sec  CCA:  48 cm/sec  SYSTOLIC ICA/CCA RATIO:  1.5  DIASTOLIC ICA/CCA RATIO:  1.5  ECA:  113 cm/sec  RIGHT CAROTID ARTERY: Intimal thickening in the right common carotid artery. Small amount of plaque in the proximal internal carotid artery without significant stenosis.  RIGHT VERTEBRAL ARTERY: Antegrade flow and normal waveform in the right vertebral artery.  LEFT CAROTID ARTERY: Mild intimal thickening in the left common carotid artery. Mild plaque at the left carotid bulb. Left internal carotid artery is tortuous without significant plaque or stenosis.  LEFT VERTEBRAL ARTERY: Antegrade flow and normal waveform in the left vertebral artery.  IMPRESSION: Mild atherosclerotic disease in the carotid arteries without significant stenosis. Estimated degree of stenosis in the internal carotid arteries is less than 50%  bilaterally.  Patent vertebral arteries.   Electronically Signed   By: Markus Daft M.D.   On: 06/21/2015 16:46    Microbiology: No results found for this or any previous visit (from the past 240 hour(s)).   Labs: Basic Metabolic Panel:  Recent Labs Lab 06/21/15 1105 06/21/15 1114  NA 141 140  K 4.0 5.3*  CL 106 102  CO2 30  --   GLUCOSE 123* 128*  BUN 20 29*  CREATININE 1.43* 1.60*  CALCIUM 8.7*  --    Liver Function Tests:  Recent Labs Lab 06/21/15 1105  AST 22  ALT 12*  ALKPHOS 48  BILITOT 0.4  PROT 6.6  ALBUMIN 3.5   No results for input(s): LIPASE, AMYLASE in the last 168 hours. No results for input(s): AMMONIA in the last 168 hours. CBC:  Recent Labs Lab 06/21/15 1105 06/21/15 1114  WBC 5.7  --   NEUTROABS 3.5  --   HGB 10.9* 11.9*  HCT 34.5* 35.0*  MCV 89.6  --   PLT 232  --  Cardiac Enzymes:  Recent Labs Lab 06/21/15 1953 06/22/15 0642  TROPONINI <0.03 <0.03   BNP: BNP (last 3 results) No results for input(s): BNP in the last 8760 hours.  ProBNP (last 3 results) No results for input(s): PROBNP in the last 8760 hours.  CBG:  Recent Labs Lab 06/21/15 1116  GLUCAP 137*       Signed:  Radene Gunning  [facility] 06/22/2015, 12:35 PM

## 2015-06-22 NOTE — Progress Notes (Signed)
IV removed by pt. Daughter at bedside states she does not want her mother to have another IV at this time. She states her mother is suppose to go home today anyway and that she just needs to rest. Daughter educated on benefits of IV and she verbalizes understanding.

## 2015-06-22 NOTE — Progress Notes (Signed)
Discharge instructions reviewed with patient's daughters. Prescription given to patient. Patient taken to lobby via wheelchair. No distress noted.

## 2015-06-23 ENCOUNTER — Encounter: Payer: Self-pay | Admitting: Orthopedic Surgery

## 2015-06-24 DIAGNOSIS — Z8673 Personal history of transient ischemic attack (TIA), and cerebral infarction without residual deficits: Secondary | ICD-10-CM | POA: Diagnosis not present

## 2015-06-24 DIAGNOSIS — Z9181 History of falling: Secondary | ICD-10-CM | POA: Diagnosis not present

## 2015-06-24 DIAGNOSIS — I129 Hypertensive chronic kidney disease with stage 1 through stage 4 chronic kidney disease, or unspecified chronic kidney disease: Secondary | ICD-10-CM | POA: Diagnosis not present

## 2015-06-24 DIAGNOSIS — Z8781 Personal history of (healed) traumatic fracture: Secondary | ICD-10-CM | POA: Diagnosis not present

## 2015-06-24 DIAGNOSIS — Z7901 Long term (current) use of anticoagulants: Secondary | ICD-10-CM | POA: Diagnosis not present

## 2015-06-24 DIAGNOSIS — D631 Anemia in chronic kidney disease: Secondary | ICD-10-CM | POA: Diagnosis not present

## 2015-06-24 DIAGNOSIS — I4891 Unspecified atrial fibrillation: Secondary | ICD-10-CM | POA: Diagnosis not present

## 2015-06-24 DIAGNOSIS — N183 Chronic kidney disease, stage 3 (moderate): Secondary | ICD-10-CM | POA: Diagnosis not present

## 2015-06-30 DIAGNOSIS — I129 Hypertensive chronic kidney disease with stage 1 through stage 4 chronic kidney disease, or unspecified chronic kidney disease: Secondary | ICD-10-CM | POA: Diagnosis not present

## 2015-06-30 DIAGNOSIS — Z8673 Personal history of transient ischemic attack (TIA), and cerebral infarction without residual deficits: Secondary | ICD-10-CM | POA: Diagnosis not present

## 2015-06-30 DIAGNOSIS — I4891 Unspecified atrial fibrillation: Secondary | ICD-10-CM | POA: Diagnosis not present

## 2015-06-30 DIAGNOSIS — D631 Anemia in chronic kidney disease: Secondary | ICD-10-CM | POA: Diagnosis not present

## 2015-06-30 DIAGNOSIS — Z8781 Personal history of (healed) traumatic fracture: Secondary | ICD-10-CM | POA: Diagnosis not present

## 2015-06-30 DIAGNOSIS — N183 Chronic kidney disease, stage 3 (moderate): Secondary | ICD-10-CM | POA: Diagnosis not present

## 2015-07-01 DIAGNOSIS — I129 Hypertensive chronic kidney disease with stage 1 through stage 4 chronic kidney disease, or unspecified chronic kidney disease: Secondary | ICD-10-CM | POA: Diagnosis not present

## 2015-07-01 DIAGNOSIS — D631 Anemia in chronic kidney disease: Secondary | ICD-10-CM | POA: Diagnosis not present

## 2015-07-01 DIAGNOSIS — Z8673 Personal history of transient ischemic attack (TIA), and cerebral infarction without residual deficits: Secondary | ICD-10-CM | POA: Diagnosis not present

## 2015-07-01 DIAGNOSIS — N183 Chronic kidney disease, stage 3 (moderate): Secondary | ICD-10-CM | POA: Diagnosis not present

## 2015-07-01 DIAGNOSIS — I6359 Cerebral infarction due to unspecified occlusion or stenosis of other cerebral artery: Secondary | ICD-10-CM | POA: Diagnosis not present

## 2015-07-01 DIAGNOSIS — Z8781 Personal history of (healed) traumatic fracture: Secondary | ICD-10-CM | POA: Diagnosis not present

## 2015-07-01 DIAGNOSIS — I4891 Unspecified atrial fibrillation: Secondary | ICD-10-CM | POA: Diagnosis not present

## 2015-07-02 DIAGNOSIS — I129 Hypertensive chronic kidney disease with stage 1 through stage 4 chronic kidney disease, or unspecified chronic kidney disease: Secondary | ICD-10-CM | POA: Diagnosis not present

## 2015-07-02 DIAGNOSIS — D631 Anemia in chronic kidney disease: Secondary | ICD-10-CM | POA: Diagnosis not present

## 2015-07-02 DIAGNOSIS — N183 Chronic kidney disease, stage 3 (moderate): Secondary | ICD-10-CM | POA: Diagnosis not present

## 2015-07-02 DIAGNOSIS — I4891 Unspecified atrial fibrillation: Secondary | ICD-10-CM | POA: Diagnosis not present

## 2015-07-02 DIAGNOSIS — Z8673 Personal history of transient ischemic attack (TIA), and cerebral infarction without residual deficits: Secondary | ICD-10-CM | POA: Diagnosis not present

## 2015-07-02 DIAGNOSIS — Z8781 Personal history of (healed) traumatic fracture: Secondary | ICD-10-CM | POA: Diagnosis not present

## 2015-07-09 DIAGNOSIS — Z8781 Personal history of (healed) traumatic fracture: Secondary | ICD-10-CM | POA: Diagnosis not present

## 2015-07-09 DIAGNOSIS — Z8673 Personal history of transient ischemic attack (TIA), and cerebral infarction without residual deficits: Secondary | ICD-10-CM | POA: Diagnosis not present

## 2015-07-09 DIAGNOSIS — D631 Anemia in chronic kidney disease: Secondary | ICD-10-CM | POA: Diagnosis not present

## 2015-07-09 DIAGNOSIS — I4891 Unspecified atrial fibrillation: Secondary | ICD-10-CM | POA: Diagnosis not present

## 2015-07-09 DIAGNOSIS — I129 Hypertensive chronic kidney disease with stage 1 through stage 4 chronic kidney disease, or unspecified chronic kidney disease: Secondary | ICD-10-CM | POA: Diagnosis not present

## 2015-07-09 DIAGNOSIS — N183 Chronic kidney disease, stage 3 (moderate): Secondary | ICD-10-CM | POA: Diagnosis not present

## 2015-07-14 DIAGNOSIS — I129 Hypertensive chronic kidney disease with stage 1 through stage 4 chronic kidney disease, or unspecified chronic kidney disease: Secondary | ICD-10-CM | POA: Diagnosis not present

## 2015-07-14 DIAGNOSIS — Z8673 Personal history of transient ischemic attack (TIA), and cerebral infarction without residual deficits: Secondary | ICD-10-CM | POA: Diagnosis not present

## 2015-07-14 DIAGNOSIS — I4891 Unspecified atrial fibrillation: Secondary | ICD-10-CM | POA: Diagnosis not present

## 2015-07-14 DIAGNOSIS — N183 Chronic kidney disease, stage 3 (moderate): Secondary | ICD-10-CM | POA: Diagnosis not present

## 2015-07-14 DIAGNOSIS — Z8781 Personal history of (healed) traumatic fracture: Secondary | ICD-10-CM | POA: Diagnosis not present

## 2015-07-14 DIAGNOSIS — D631 Anemia in chronic kidney disease: Secondary | ICD-10-CM | POA: Diagnosis not present

## 2015-07-23 DIAGNOSIS — Z8673 Personal history of transient ischemic attack (TIA), and cerebral infarction without residual deficits: Secondary | ICD-10-CM | POA: Diagnosis not present

## 2015-07-23 DIAGNOSIS — I4891 Unspecified atrial fibrillation: Secondary | ICD-10-CM | POA: Diagnosis not present

## 2015-07-23 DIAGNOSIS — N183 Chronic kidney disease, stage 3 (moderate): Secondary | ICD-10-CM | POA: Diagnosis not present

## 2015-07-23 DIAGNOSIS — Z8781 Personal history of (healed) traumatic fracture: Secondary | ICD-10-CM | POA: Diagnosis not present

## 2015-07-23 DIAGNOSIS — I129 Hypertensive chronic kidney disease with stage 1 through stage 4 chronic kidney disease, or unspecified chronic kidney disease: Secondary | ICD-10-CM | POA: Diagnosis not present

## 2015-07-23 DIAGNOSIS — D631 Anemia in chronic kidney disease: Secondary | ICD-10-CM | POA: Diagnosis not present

## 2015-08-03 DIAGNOSIS — I6310 Cerebral infarction due to embolism of unspecified precerebral artery: Secondary | ICD-10-CM | POA: Diagnosis not present

## 2015-08-03 DIAGNOSIS — R269 Unspecified abnormalities of gait and mobility: Secondary | ICD-10-CM | POA: Diagnosis not present

## 2015-08-03 DIAGNOSIS — G3184 Mild cognitive impairment, so stated: Secondary | ICD-10-CM | POA: Diagnosis not present

## 2015-08-03 DIAGNOSIS — I4891 Unspecified atrial fibrillation: Secondary | ICD-10-CM | POA: Diagnosis not present

## 2015-08-06 DIAGNOSIS — Z8781 Personal history of (healed) traumatic fracture: Secondary | ICD-10-CM | POA: Diagnosis not present

## 2015-08-06 DIAGNOSIS — I4891 Unspecified atrial fibrillation: Secondary | ICD-10-CM | POA: Diagnosis not present

## 2015-08-06 DIAGNOSIS — Z8673 Personal history of transient ischemic attack (TIA), and cerebral infarction without residual deficits: Secondary | ICD-10-CM | POA: Diagnosis not present

## 2015-08-06 DIAGNOSIS — N183 Chronic kidney disease, stage 3 (moderate): Secondary | ICD-10-CM | POA: Diagnosis not present

## 2015-08-06 DIAGNOSIS — D631 Anemia in chronic kidney disease: Secondary | ICD-10-CM | POA: Diagnosis not present

## 2015-08-06 DIAGNOSIS — I129 Hypertensive chronic kidney disease with stage 1 through stage 4 chronic kidney disease, or unspecified chronic kidney disease: Secondary | ICD-10-CM | POA: Diagnosis not present

## 2015-08-17 DIAGNOSIS — I129 Hypertensive chronic kidney disease with stage 1 through stage 4 chronic kidney disease, or unspecified chronic kidney disease: Secondary | ICD-10-CM | POA: Diagnosis not present

## 2015-08-17 DIAGNOSIS — N183 Chronic kidney disease, stage 3 (moderate): Secondary | ICD-10-CM | POA: Diagnosis not present

## 2015-08-17 DIAGNOSIS — Z8781 Personal history of (healed) traumatic fracture: Secondary | ICD-10-CM | POA: Diagnosis not present

## 2015-08-17 DIAGNOSIS — Z8673 Personal history of transient ischemic attack (TIA), and cerebral infarction without residual deficits: Secondary | ICD-10-CM | POA: Diagnosis not present

## 2015-08-17 DIAGNOSIS — I4891 Unspecified atrial fibrillation: Secondary | ICD-10-CM | POA: Diagnosis not present

## 2015-08-17 DIAGNOSIS — D631 Anemia in chronic kidney disease: Secondary | ICD-10-CM | POA: Diagnosis not present

## 2015-09-06 DIAGNOSIS — H401124 Primary open-angle glaucoma, left eye, indeterminate stage: Secondary | ICD-10-CM | POA: Diagnosis not present

## 2015-09-30 DIAGNOSIS — H353120 Nonexudative age-related macular degeneration, left eye, stage unspecified: Secondary | ICD-10-CM | POA: Diagnosis not present

## 2015-09-30 DIAGNOSIS — Q111 Other anophthalmos: Secondary | ICD-10-CM | POA: Diagnosis not present

## 2015-09-30 DIAGNOSIS — Z961 Presence of intraocular lens: Secondary | ICD-10-CM | POA: Diagnosis not present

## 2015-09-30 DIAGNOSIS — H401124 Primary open-angle glaucoma, left eye, indeterminate stage: Secondary | ICD-10-CM | POA: Diagnosis not present

## 2015-10-18 DIAGNOSIS — I10 Essential (primary) hypertension: Secondary | ICD-10-CM | POA: Diagnosis not present

## 2015-10-18 DIAGNOSIS — E782 Mixed hyperlipidemia: Secondary | ICD-10-CM | POA: Diagnosis not present

## 2015-10-18 DIAGNOSIS — E119 Type 2 diabetes mellitus without complications: Secondary | ICD-10-CM | POA: Diagnosis not present

## 2015-10-20 DIAGNOSIS — I639 Cerebral infarction, unspecified: Secondary | ICD-10-CM | POA: Diagnosis not present

## 2015-10-20 DIAGNOSIS — R7301 Impaired fasting glucose: Secondary | ICD-10-CM | POA: Diagnosis not present

## 2015-10-20 DIAGNOSIS — H409 Unspecified glaucoma: Secondary | ICD-10-CM | POA: Diagnosis not present

## 2015-10-20 DIAGNOSIS — H3530 Unspecified macular degeneration: Secondary | ICD-10-CM | POA: Diagnosis not present

## 2015-10-20 DIAGNOSIS — N183 Chronic kidney disease, stage 3 (moderate): Secondary | ICD-10-CM | POA: Diagnosis not present

## 2015-10-20 DIAGNOSIS — N390 Urinary tract infection, site not specified: Secondary | ICD-10-CM | POA: Diagnosis not present

## 2015-10-20 DIAGNOSIS — I482 Chronic atrial fibrillation: Secondary | ICD-10-CM | POA: Diagnosis not present

## 2015-10-20 DIAGNOSIS — E782 Mixed hyperlipidemia: Secondary | ICD-10-CM | POA: Diagnosis not present

## 2016-03-06 DIAGNOSIS — H401124 Primary open-angle glaucoma, left eye, indeterminate stage: Secondary | ICD-10-CM | POA: Diagnosis not present

## 2016-04-19 DIAGNOSIS — D509 Iron deficiency anemia, unspecified: Secondary | ICD-10-CM | POA: Diagnosis not present

## 2016-04-19 DIAGNOSIS — I639 Cerebral infarction, unspecified: Secondary | ICD-10-CM | POA: Diagnosis not present

## 2016-04-19 DIAGNOSIS — R7301 Impaired fasting glucose: Secondary | ICD-10-CM | POA: Diagnosis not present

## 2016-04-19 DIAGNOSIS — I482 Chronic atrial fibrillation: Secondary | ICD-10-CM | POA: Diagnosis not present

## 2016-04-19 DIAGNOSIS — I10 Essential (primary) hypertension: Secondary | ICD-10-CM | POA: Diagnosis not present

## 2016-04-19 DIAGNOSIS — N183 Chronic kidney disease, stage 3 (moderate): Secondary | ICD-10-CM | POA: Diagnosis not present

## 2016-04-19 DIAGNOSIS — E782 Mixed hyperlipidemia: Secondary | ICD-10-CM | POA: Diagnosis not present

## 2016-06-06 IMAGING — MR MR HEAD W/O CM
8 of 10 series · 33 of 48 positions shown · non-contrast
Comparison: CT head earlier today.

CLINICAL DATA: Altered mental status and RIGHT-sided weakness for 1
day.

EXAM:
MRI HEAD WITHOUT CONTRAST
MRA HEAD WITHOUT CONTRAST
TECHNIQUE: Multiplanar, multiecho pulse sequences of the brain and surrounding
structures were obtained without intravenous contrast. Angiographic
images of the head were obtained using MRA technique without
contrast.

[Series 2: [id number] · sagittal · 5.0mm · 0.45mm/px · 2 of 20 slices shown]
[im 1/20]
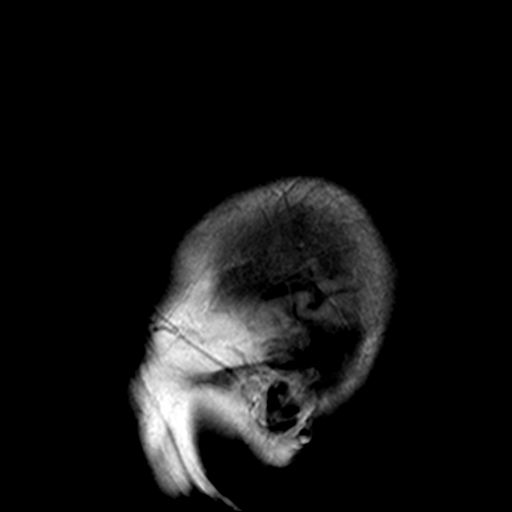
[im 20/20]
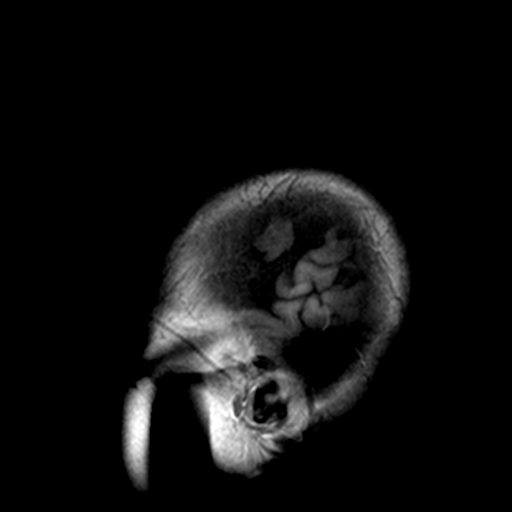

[Series 5: T2 · axial · 5.0mm · 0.75mm/px · z∈[-129,+14]mm · 3 of 23 slices shown (1 of 2)]
[im 1/23]
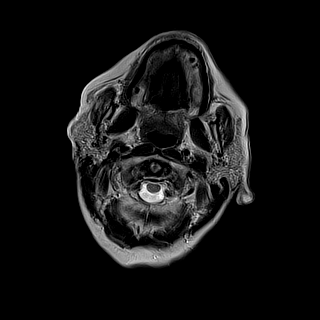
[im 12/23]
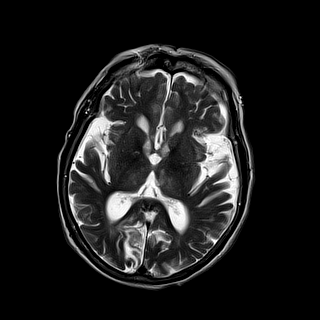
[im 23/23]
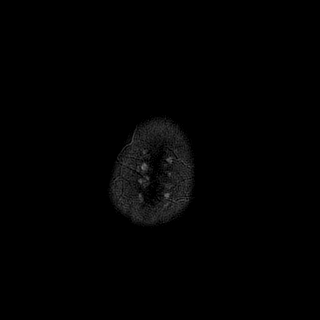

[Series 8: FLAIR · axial · 5.0mm · 0.94mm/px · z∈[-129,+14]mm · 3 of 23 slices shown]
[im 1/23]
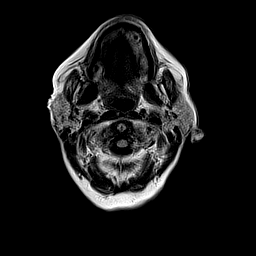
[im 12/23]
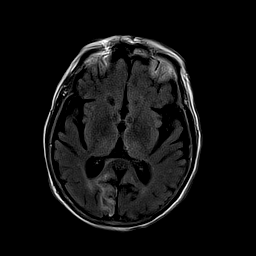
[im 23/23]
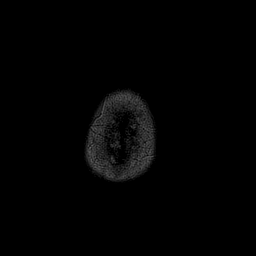

[Series 9: T1 · axial · 2.0mm · 0.47mm/px · z∈[-143,+45]mm · 9 of 95 slices shown]
[im 1/95]
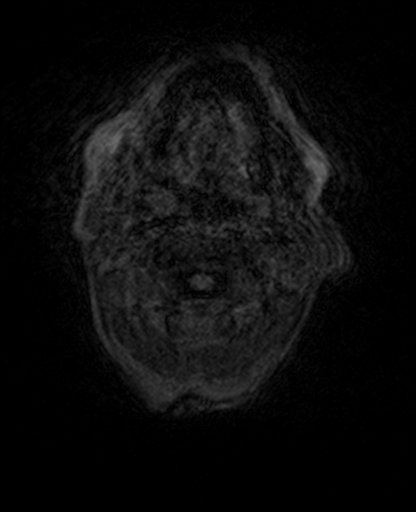
[im 18/95]
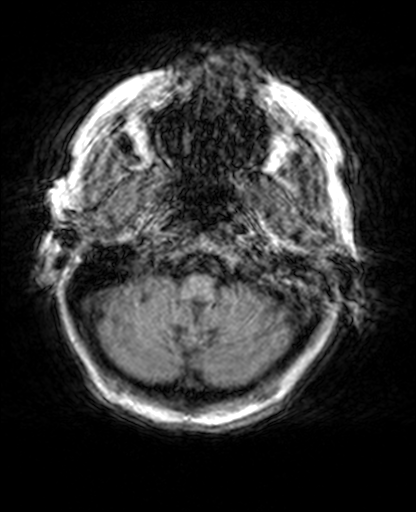
[im 26/95]
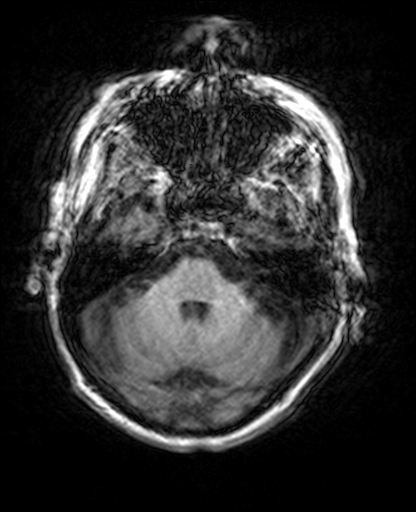
[im 43/95]
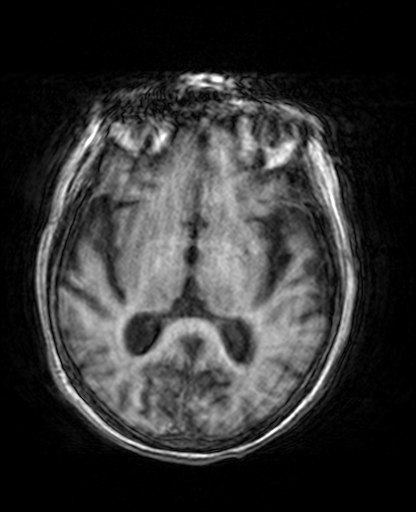
[im 52/95]
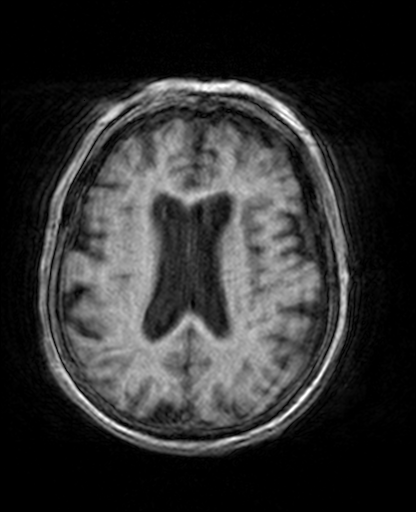
[im 69/95]
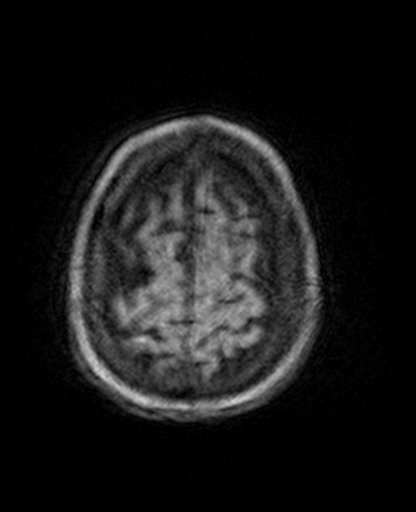
[im 77/95]
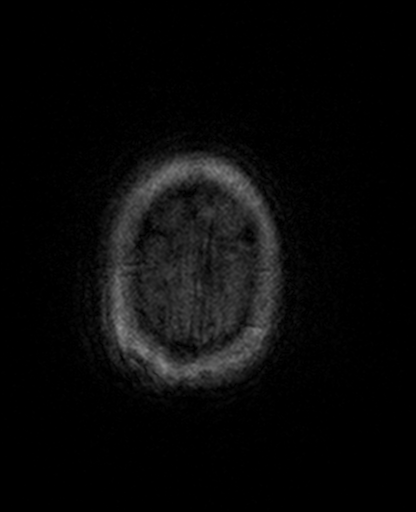
[im 86/95]
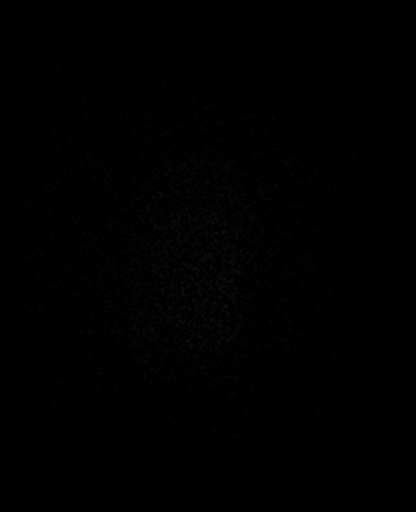
[im 95/95]
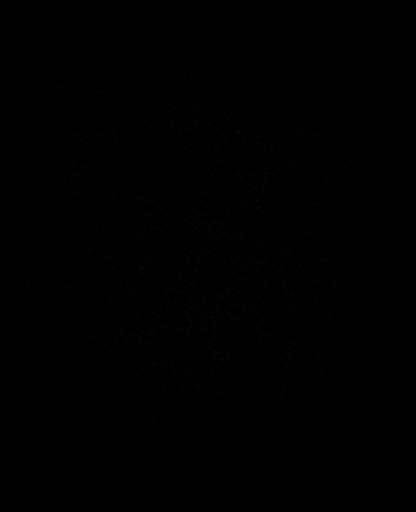

[Series 10: trauma axial · axial · 5.0mm · 0.45mm/px · z∈[-123,+7]mm · 3 of 21 slices shown]
[im 1/21]
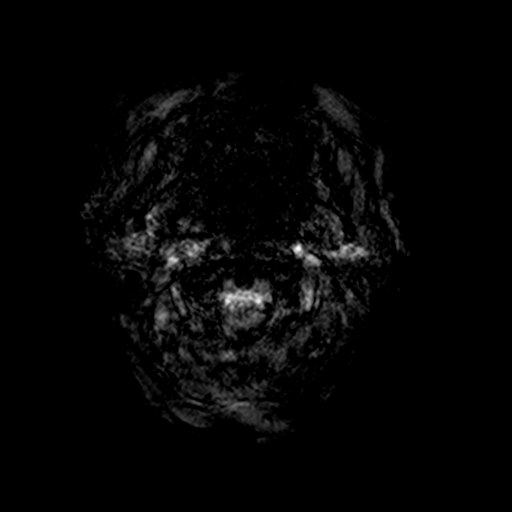
[im 11/21]
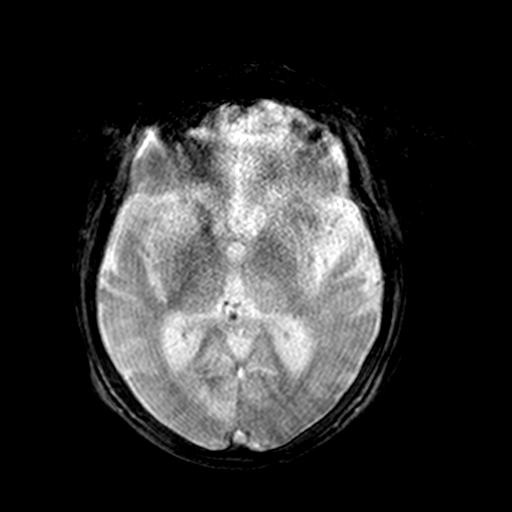
[im 21/21]
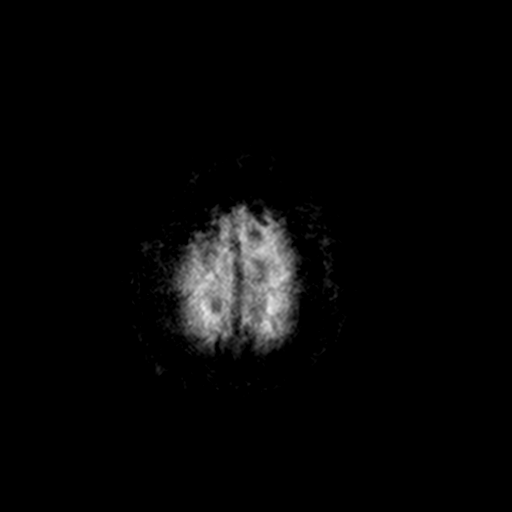

[Series 11: T2 · coronal · 5.0mm · 0.64mm/px · 3 of 28 slices shown (2 of 2)]
[im 1/28]
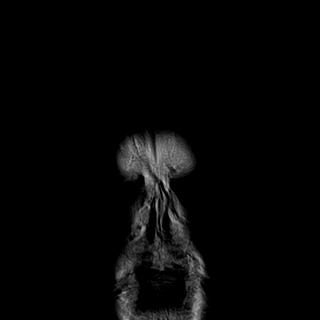
[im 14/28]
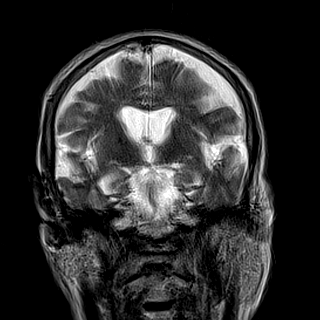
[im 28/28]
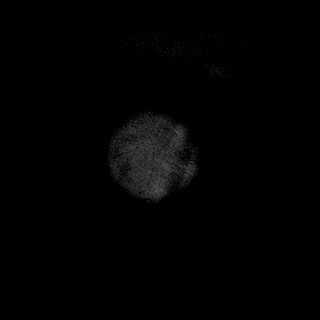

[Series 100: <mpr thick range> · axial · 3.0mm · 0.82mm/px · z∈[-124,+2]mm · 5 of 43 slices shown]
[im 1/43]
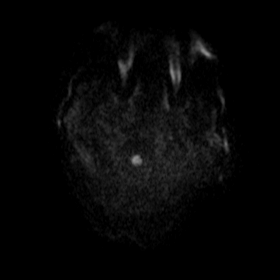
[im 11/43]
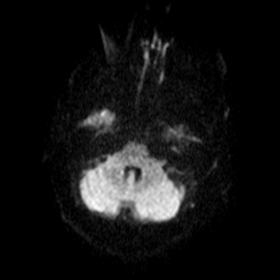
[im 22/43]
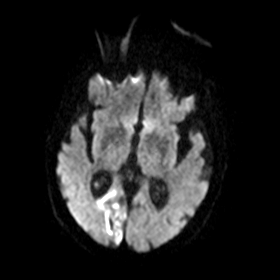
[im 32/43]
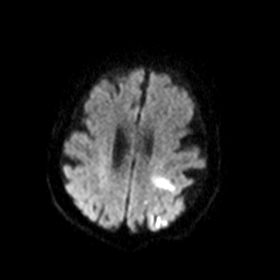
[im 43/43]
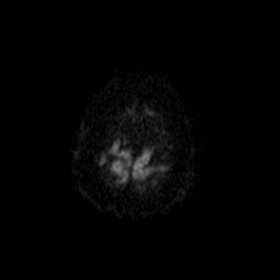

[Series 101: <mpr thick range(1)> · coronal · 3.0mm · 0.82mm/px · 5 of 50 slices shown]
[im 1/50]
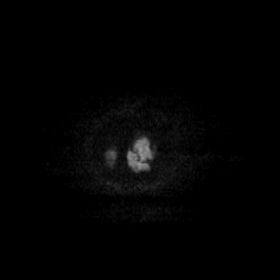
[im 10/50]
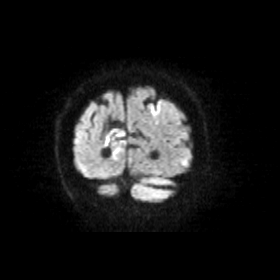
[im 20/50]
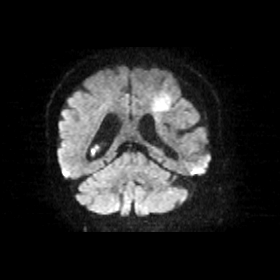
[im 30/50]
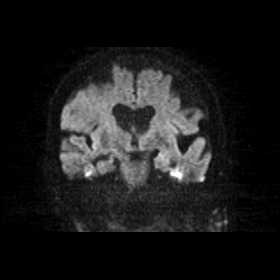
[im 40/50]
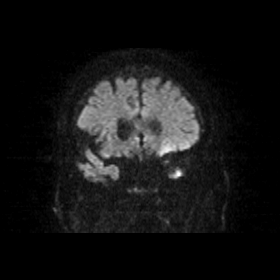

[33 of 48 positions shown; findings below may reference images not displayed]

FINDINGS: MRI HEAD FINDINGS

The patient was unable to remain motionless for the exam. Small or
subtle lesions could be overlooked.

Multifocal areas of acute infarction affect the LEFT hemisphere
primarily, LEFT posterior frontal and anterior parietal cortex and
subcortical white matter. No acute hemorrhage.

No mass lesion, hydrocephalus, or extra-axial. Suspected laminar
necrosis associated with an old RIGHT PCA territory infarction,
although difficult to to exclude completely small associated areas
of additional RIGHT PCA territory acute infarction.

Generalized atrophy. Chronic microvascular ischemic change. Marked
pannus surrounding C2 mildly effaces the cervicomedullary junction
without definite cord compression. Large remote LEFT thalamic
lacunar infarct. Prosthetic globe on the RIGHT. LEFT cataract
extraction. No acute sinus or mastoid disease.

MRA HEAD FINDINGS

Of misregistration due to motion. No gross carotid or basilar
stenosis. Vertebrals contribute to basilar formation, LEFT dominant.
Mild irregularity of the proximal anterior, middle, and posterior
cerebral arteries without apparent flow limiting stenosis.
Moderately disease T2 and T3 MCA segments bilaterally, without
definite proximal branch occlusion. Both distal PCAs are patent.
Difficult to assess for cerebellar branch occlusion.
IMPRESSION: Motion degraded exam demonstrating acute LEFT hemisphere infarction
involving the posterior frontal, anterior parietal cortex and
subcortical white matter. No acute hemorrhage.

Chronic RIGHT PCA infarct with laminar necrosis.

Atrophy and small vessel disease.

Within limits of detection due to motion, no proximal large vessel
occlusion on MRA. Intracranial atherosclerotic change as described.

## 2016-06-19 DIAGNOSIS — Z23 Encounter for immunization: Secondary | ICD-10-CM | POA: Diagnosis not present

## 2016-06-19 DIAGNOSIS — H401124 Primary open-angle glaucoma, left eye, indeterminate stage: Secondary | ICD-10-CM | POA: Diagnosis not present

## 2016-10-05 DIAGNOSIS — Q111 Other anophthalmos: Secondary | ICD-10-CM | POA: Diagnosis not present

## 2016-10-05 DIAGNOSIS — H401124 Primary open-angle glaucoma, left eye, indeterminate stage: Secondary | ICD-10-CM | POA: Diagnosis not present

## 2016-10-05 DIAGNOSIS — H353122 Nonexudative age-related macular degeneration, left eye, intermediate dry stage: Secondary | ICD-10-CM | POA: Diagnosis not present

## 2016-10-05 DIAGNOSIS — Z961 Presence of intraocular lens: Secondary | ICD-10-CM | POA: Diagnosis not present

## 2016-10-17 DIAGNOSIS — E782 Mixed hyperlipidemia: Secondary | ICD-10-CM | POA: Diagnosis not present

## 2016-10-17 DIAGNOSIS — R7301 Impaired fasting glucose: Secondary | ICD-10-CM | POA: Diagnosis not present

## 2016-11-20 DIAGNOSIS — N390 Urinary tract infection, site not specified: Secondary | ICD-10-CM | POA: Diagnosis not present

## 2017-01-15 DIAGNOSIS — H401124 Primary open-angle glaucoma, left eye, indeterminate stage: Secondary | ICD-10-CM | POA: Diagnosis not present

## 2017-02-04 ENCOUNTER — Encounter (HOSPITAL_COMMUNITY): Payer: Self-pay | Admitting: Emergency Medicine

## 2017-02-04 ENCOUNTER — Observation Stay (HOSPITAL_COMMUNITY)
Admission: EM | Admit: 2017-02-04 | Discharge: 2017-02-05 | Disposition: A | Discharge status: Home / Self Care | Payer: Medicare Other | Attending: Family Medicine | Admitting: Family Medicine

## 2017-02-04 ENCOUNTER — Observation Stay (HOSPITAL_COMMUNITY): Payer: Medicare Other

## 2017-02-04 ENCOUNTER — Emergency Department (HOSPITAL_COMMUNITY): Payer: Medicare Other

## 2017-02-04 DIAGNOSIS — I129 Hypertensive chronic kidney disease with stage 1 through stage 4 chronic kidney disease, or unspecified chronic kidney disease: Secondary | ICD-10-CM | POA: Diagnosis not present

## 2017-02-04 DIAGNOSIS — R9431 Abnormal electrocardiogram [ECG] [EKG]: Secondary | ICD-10-CM | POA: Diagnosis not present

## 2017-02-04 DIAGNOSIS — L8990 Pressure ulcer of unspecified site, unspecified stage: Secondary | ICD-10-CM | POA: Insufficient documentation

## 2017-02-04 DIAGNOSIS — S299XXA Unspecified injury of thorax, initial encounter: Secondary | ICD-10-CM | POA: Diagnosis not present

## 2017-02-04 DIAGNOSIS — R531 Weakness: Secondary | ICD-10-CM | POA: Diagnosis not present

## 2017-02-04 DIAGNOSIS — N183 Chronic kidney disease, stage 3 unspecified: Secondary | ICD-10-CM | POA: Diagnosis present

## 2017-02-04 DIAGNOSIS — N390 Urinary tract infection, site not specified: Principal | ICD-10-CM | POA: Insufficient documentation

## 2017-02-04 DIAGNOSIS — R29818 Other symptoms and signs involving the nervous system: Secondary | ICD-10-CM | POA: Diagnosis not present

## 2017-02-04 DIAGNOSIS — Z5181 Encounter for therapeutic drug level monitoring: Secondary | ICD-10-CM | POA: Insufficient documentation

## 2017-02-04 DIAGNOSIS — I10 Essential (primary) hypertension: Secondary | ICD-10-CM | POA: Diagnosis present

## 2017-02-04 DIAGNOSIS — S3993XA Unspecified injury of pelvis, initial encounter: Secondary | ICD-10-CM | POA: Diagnosis not present

## 2017-02-04 DIAGNOSIS — R404 Transient alteration of awareness: Secondary | ICD-10-CM | POA: Diagnosis not present

## 2017-02-04 DIAGNOSIS — R0682 Tachypnea, not elsewhere classified: Secondary | ICD-10-CM | POA: Diagnosis not present

## 2017-02-04 DIAGNOSIS — R4182 Altered mental status, unspecified: Secondary | ICD-10-CM | POA: Diagnosis present

## 2017-02-04 DIAGNOSIS — Z79899 Other long term (current) drug therapy: Secondary | ICD-10-CM | POA: Insufficient documentation

## 2017-02-04 DIAGNOSIS — I4891 Unspecified atrial fibrillation: Secondary | ICD-10-CM | POA: Diagnosis present

## 2017-02-04 DIAGNOSIS — S3992XA Unspecified injury of lower back, initial encounter: Secondary | ICD-10-CM | POA: Diagnosis not present

## 2017-02-04 LAB — CBC WITH DIFFERENTIAL/PLATELET
Basophils Absolute: 0.1 10*3/uL (ref 0.0–0.1)
Basophils Relative: 1 %
Eosinophils Absolute: 0.1 10*3/uL (ref 0.0–0.7)
Eosinophils Relative: 1 %
HCT: 39.3 % (ref 36.0–46.0)
Hemoglobin: 12.8 g/dL (ref 12.0–15.0)
Lymphocytes Relative: 11 %
Lymphs Abs: 0.9 10*3/uL (ref 0.7–4.0)
MCH: 29 pg (ref 26.0–34.0)
MCHC: 32.6 g/dL (ref 30.0–36.0)
MCV: 88.9 fL (ref 78.0–100.0)
MONO ABS: 0.7 10*3/uL (ref 0.1–1.0)
Monocytes Relative: 8 %
Neutro Abs: 7.1 10*3/uL (ref 1.7–7.7)
Neutrophils Relative %: 79 %
PLATELETS: 204 10*3/uL (ref 150–400)
RBC: 4.42 MIL/uL (ref 3.87–5.11)
RDW: 14.8 % (ref 11.5–15.5)
WBC: 8.8 10*3/uL (ref 4.0–10.5)

## 2017-02-04 LAB — URINALYSIS, ROUTINE W REFLEX MICROSCOPIC
BILIRUBIN URINE: NEGATIVE
Glucose, UA: NEGATIVE mg/dL
HGB URINE DIPSTICK: NEGATIVE
KETONES UR: NEGATIVE mg/dL
NITRITE: POSITIVE — AB
PROTEIN: 100 mg/dL — AB
Specific Gravity, Urine: 1.02 (ref 1.005–1.030)
pH: 5 (ref 5.0–8.0)

## 2017-02-04 LAB — I-STAT TROPONIN, ED: Troponin i, poc: 0.01 ng/mL (ref 0.00–0.08)

## 2017-02-04 LAB — COMPREHENSIVE METABOLIC PANEL
ALBUMIN: 4.1 g/dL (ref 3.5–5.0)
ALT: 18 U/L (ref 14–54)
ANION GAP: 10 (ref 5–15)
AST: 31 U/L (ref 15–41)
Alkaline Phosphatase: 51 U/L (ref 38–126)
BILIRUBIN TOTAL: 0.6 mg/dL (ref 0.3–1.2)
BUN: 30 mg/dL — AB (ref 6–20)
CHLORIDE: 105 mmol/L (ref 101–111)
CO2: 24 mmol/L (ref 22–32)
Calcium: 9.6 mg/dL (ref 8.9–10.3)
Creatinine, Ser: 1.5 mg/dL — ABNORMAL HIGH (ref 0.44–1.00)
GFR calc Af Amer: 33 mL/min — ABNORMAL LOW (ref >60)
GFR, EST NON AFRICAN AMERICAN: 28 mL/min — AB (ref >60)
GLUCOSE: 184 mg/dL — AB (ref 65–99)
Potassium: 3.9 mmol/L (ref 3.5–5.1)
Sodium: 139 mmol/L (ref 135–145)
Total Protein: 7.5 g/dL (ref 6.5–8.1)

## 2017-02-04 LAB — I-STAT CHEM 8, ED
BUN: 31 mg/dL — AB (ref 6–20)
CREATININE: 1.5 mg/dL — AB (ref 0.44–1.00)
Calcium, Ion: 1.17 mmol/L (ref 1.15–1.40)
Chloride: 103 mmol/L (ref 101–111)
Glucose, Bld: 179 mg/dL — ABNORMAL HIGH (ref 65–99)
HEMATOCRIT: 40 % (ref 36.0–46.0)
Hemoglobin: 13.6 g/dL (ref 12.0–15.0)
Potassium: 3.9 mmol/L (ref 3.5–5.1)
Sodium: 142 mmol/L (ref 135–145)
TCO2: 26 mmol/L (ref 0–100)

## 2017-02-04 LAB — RAPID URINE DRUG SCREEN, HOSP PERFORMED
AMPHETAMINES: NOT DETECTED
Barbiturates: NOT DETECTED
Benzodiazepines: NOT DETECTED
Cocaine: NOT DETECTED
Opiates: NOT DETECTED
Tetrahydrocannabinol: NOT DETECTED

## 2017-02-04 LAB — ETHANOL: Alcohol, Ethyl (B): <5 mg/dL (ref <5)

## 2017-02-04 LAB — PROTIME-INR
INR: 1.24
Prothrombin Time: 15.7 seconds — ABNORMAL HIGH (ref 11.4–15.2)

## 2017-02-04 LAB — CK: Total CK: 123 U/L (ref 38–234)

## 2017-02-04 LAB — TROPONIN I: Troponin I: 0.04 ng/mL (ref <0.03)

## 2017-02-04 LAB — APTT: aPTT: 29 seconds (ref 24–36)

## 2017-02-04 MED ORDER — SODIUM CHLORIDE 0.9 % IV BOLUS (SEPSIS)
1000.0000 mL | Freq: Once | INTRAVENOUS | Status: AC
  Administered 2017-02-04: 1000 mL via INTRAVENOUS

## 2017-02-04 MED ORDER — ACETAMINOPHEN 325 MG PO TABS: 650.0000 mg | ORAL_TABLET | Freq: Four times a day (QID) | ORAL | Status: DC | PRN

## 2017-02-04 MED ORDER — CEPHALEXIN 500 MG PO CAPS: 500.0000 mg | ORAL_CAPSULE | Freq: Four times a day (QID) | ORAL | 0 refills | Status: AC

## 2017-02-04 MED ORDER — ONDANSETRON HCL 4 MG/2ML IJ SOLN: 4.0000 mg | Freq: Four times a day (QID) | INTRAMUSCULAR | Status: DC | PRN

## 2017-02-04 MED ORDER — POTASSIUM CHLORIDE IN NACL 20-0.9 MEQ/L-% IV SOLN
INTRAVENOUS | Status: AC
  Administered 2017-02-04: 23:00:00 via INTRAVENOUS

## 2017-02-04 MED ORDER — ACETAMINOPHEN 650 MG RE SUPP: 650.0000 mg | Freq: Four times a day (QID) | RECTAL | Status: DC | PRN

## 2017-02-04 MED ORDER — DEXTROSE 5 % IV SOLN
1.0000 g | Freq: Once | INTRAVENOUS | Status: AC
  Administered 2017-02-04: 1 g via INTRAVENOUS
  Filled 2017-02-04: qty 10

## 2017-02-04 MED ORDER — DOCUSATE SODIUM 100 MG PO CAPS
100.0000 mg | ORAL_CAPSULE | Freq: Two times a day (BID) | ORAL | Status: DC
  Administered 2017-02-05: 100 mg via ORAL
  Filled 2017-02-04: qty 1

## 2017-02-04 MED ORDER — ONDANSETRON HCL 4 MG PO TABS: 4.0000 mg | ORAL_TABLET | Freq: Four times a day (QID) | ORAL | Status: DC | PRN

## 2017-02-04 MED ORDER — APIXABAN 2.5 MG PO TABS
2.5000 mg | ORAL_TABLET | Freq: Two times a day (BID) | ORAL | Status: DC
  Administered 2017-02-05: 2.5 mg via ORAL
  Filled 2017-02-04: qty 1

## 2017-02-04 MED ORDER — LATANOPROST 0.005 % OP SOLN
1.0000 [drp] | Freq: Every day | OPHTHALMIC | Status: DC
  Filled 2017-02-04: qty 2.5

## 2017-02-04 MED ORDER — DEXTROSE 5 % IV SOLN
1.0000 g | INTRAVENOUS | Status: DC
  Filled 2017-02-04 (×2): qty 10

## 2017-02-04 MED ORDER — LATANOPROST 0.005 % OP SOLN
OPHTHALMIC | Status: AC
  Filled 2017-02-04: qty 2.5

## 2017-02-04 MED ORDER — BRIMONIDINE TARTRATE-TIMOLOL 0.2-0.5 % OP SOLN
1.0000 [drp] | Freq: Two times a day (BID) | OPHTHALMIC | Status: DC
  Filled 2017-02-04: qty 5

## 2017-02-04 NOTE — ED Notes (Signed)
Notified Dr. Dayna Barker of changes in neurocheck, he is reassessing pt.

## 2017-02-04 NOTE — ED Notes (Signed)
hospitalist at bedside

## 2017-02-04 NOTE — ED Notes (Signed)
Call to lab: POC- Ed regarding urine culture

## 2017-02-04 NOTE — ED Notes (Signed)
Continues in radiology 

## 2017-02-04 NOTE — ED Notes (Signed)
Dr Jerilynn Mages in to recheck and to speak with pt and daughters-   Pt continues very sleepy and difficult to arouse. After a lengthy conversation, pt's daughter agree that pt should be admitted

## 2017-02-04 NOTE — ED Provider Notes (Signed)
[facility] DEPT Provider Note   CSN: [id number] Arrival date & time: 02/04/17  1139   By signing my name below, I, Hilbert Odor, attest that this documentation has been prepared under the direction and in the presence of Xai Frerking, Corene Cornea, MD. Electronically Signed: Hilbert Odor, Scribe. 02/04/17. 11:49 AM. History   Chief Complaint Chief Complaint  Patient presents with  . Altered Mental Status    LEVEL 5 CAVEAT: Altered Mental Status/Confusion The history is provided by the patient. No language interpreter was used.  HPI Comments: Elizabeth Pierce is a 83 y.o. female with hx of HLD, HTN, renal failure, and stroke brought in by ambulance, who presents to the Emergency Department with altered mental status that began prior to her arrival. She was recently diagnosed with a UTI 2 days ago and was placed on Cipro. She was getting out of bed this morning when she slid to the floor. She has had periods of confusion while en route to the ED per EMS. No other complaints reported.  Past Medical History:  Diagnosis Date  . A-fib ([place])   . Acute renal failure ([place])   . Cataracts, bilateral   . CKD (chronic kidney disease), stage III    per PCP notes  . Fracture of femoral neck, left ([place])   . Hyperlipidemia   . Hypertension   . Stroke [facility])     Patient Active Problem List   Diagnosis Date Noted  . Stroke ([place]) 06/21/2015  . Hyperkalemia 06/21/2015  . Acute renal failure ([place]) 06/21/2015  . A-fib ([place]) 06/21/2015  . Anemia 06/21/2015  . Cataracts, bilateral   . Surgical aftercare, musculoskeletal system 04/08/2015  . Contusion 03/16/2015  . Left displaced femoral neck fracture ([place]) 03/06/2015  . Elevated LFTs 03/06/2015  . Fracture of femoral neck, left ([place]) 03/05/2015  . A-fib ([place])   . CKD (chronic kidney disease), stage III   . Hypertension     Past Surgical History:  Procedure Laterality Date  . HIP PINNING,CANNULATED Left 03/06/2015   Procedure: CANNULATED HIP  PINNING;  Surgeon: Carole Civil, MD;  Location: [facility];  Service: Orthopedics;  Laterality: Left;    OB History    Gravida Para Term Preterm AB Living   0 0 0 0 0     SAB TAB Ectopic Multiple Live Births   0 0 0           Home Medications    Prior to Admission medications   Medication Sig Start Date End Date Taking? Authorizing Provider  acetaminophen (TYLENOL) 325 MG tablet Take 325 mg by mouth every 6 (six) hours as needed for mild pain, moderate pain, fever or headache.    Yes [provider]  apixaban (ELIQUIS) 2.5 MG TABS tablet Take 1 tablet (2.5 mg total) by mouth 2 (two) times daily. 06/22/15  Yes Black, Lezlie Octave, NP  ciprofloxacin (CIPRO) 250 MG tablet Take 250 mg by mouth 2 (two) times daily. 02/02/17 02/07/17 Yes [provider]  COMBIGAN 0.2-0.5 % ophthalmic solution Place 1 drop into the left eye every 12 (twelve) hours.    Yes [provider]  docusate sodium (COLACE) 100 MG capsule Take 100 mg by mouth 2 (two) times daily.    Yes [provider]  Ferrous Sulfate (SLOW FE) 142 (45 Fe) MG TBCR Take 1 tablet by mouth 2 (two) times daily.   Yes [provider]  Multiple Vitamins-Minerals (MULTIVITAMIN WITH MINERALS) tablet Take 1 tablet by mouth daily.  Yes [provider]  Multiple Vitamins-Minerals (PRESERVISION AREDS PO) Take 1 capsule by mouth daily.    Yes [provider]  simethicone (MYLICON) 80 MG chewable tablet Chew 80 mg by mouth daily.    Yes [provider]  TRAVATAN Z 0.004 % SOLN ophthalmic solution Place 1 drop into the left eye at bedtime.    Yes [provider]    Family History No family history on file.  Social History Social History  Substance Use Topics  . Smoking status: Never Smoker  . Smokeless tobacco: Current User    Types: Snuff  . Alcohol use No     Allergies   Tramadol   Review of Systems Review of Systems  Unable to perform ROS: Mental status  change     Physical Exam Updated Vital Signs BP 134/73   Pulse 87   Temp 97.8 F (36.6 C) (Oral)   Resp (!) 33   Ht 5\' 1"  (1.549 m)   Wt 112 lb (50.8 kg)   SpO2 98%   BMI 21.16 kg/m   Physical Exam  Constitutional: She is oriented to person, place, and time. She appears well-developed and well-nourished.  HENT:  Head: Normocephalic.  Eyes: EOM are normal.  Disconjugate gaze.  Neck: Normal range of motion.  Pulmonary/Chest: Effort normal.  Lungs nl. Breathing is a little fast.  Abdominal: Soft. She exhibits no distension. There is no tenderness.  Musculoskeletal: Normal range of motion. She exhibits no edema or deformity. Tenderness: in c/t/l spine, pelvis or chest.  Neurological: She is alert and oriented to person, place, and time.  Cranial nerves normal. Sensation normal. Upper and lower motor strength normal.   Skin: Skin is warm. No erythema. No pallor.  Psychiatric: She has a normal mood and affect.  Nursing note and vitals reviewed.  ED Treatments / Results  DIAGNOSTIC STUDIES: Oxygen Saturation is 98% on RA, normal by my interpretation.    Labs (all labs ordered are listed, but only abnormal results are displayed) Labs Reviewed  TROPONIN I - Abnormal; Notable for the following:       Result Value   Troponin I 0.04 (*)    All other components within normal limits  COMPREHENSIVE METABOLIC PANEL - Abnormal; Notable for the following:    Glucose, Bld 184 (*)    BUN 30 (*)    Creatinine, Ser 1.50 (*)    GFR calc non Af Amer 28 (*)    GFR calc Af Amer 33 (*)    All other components within normal limits  PROTIME-INR - Abnormal; Notable for the following:    Prothrombin Time 15.7 (*)    All other components within normal limits  I-STAT CHEM 8, ED - Abnormal; Notable for the following:    BUN 31 (*)    Creatinine, Ser 1.50 (*)    Glucose, Bld 179 (*)    All other components within normal limits  CBC WITH DIFFERENTIAL/PLATELET  ETHANOL  APTT  RAPID URINE  DRUG SCREEN, HOSP PERFORMED  URINALYSIS, ROUTINE W REFLEX MICROSCOPIC  I-STAT TROPOININ, ED    EKG  EKG Interpretation  Date/Time:  Sunday Feb 04 2017 11:40:05 EDT Ventricular Rate:  110 PR Interval:    QRS Duration: 72 QT Interval:  323 QTC Calculation: 437 R Axis:   87 Text Interpretation:  Atrial fibrillation Borderline right axis deviation LVH with secondary repolarization abnormality faster rate otherwise similar to previous Confirmed by [facility] MD, Corene Cornea [phone]) on 02/04/2017 12:33:10 PM  Radiology Dg Thoracic Spine 2 View  Result Date: 02/04/2017 CLINICAL DATA:  Initial evaluation for acute trauma, fall. EXAM: THORACIC SPINE 2 VIEWS COMPARISON:  None. FINDINGS: Mild scoliosis of the thoracolumbar spine. Vertebral bodies otherwise normally aligned with preservation of the normal thoracic kyphosis. Height loss at the T11 vertebral body, better seen on corresponding lumbar spine radiograph. Vertebral body heights otherwise maintained. No other acute fracture. No acute soft tissue abnormality. Visualized lungs are clear. Aortic caps ptosis. Cholelithiasis. IMPRESSION: 1. Age indeterminate height loss at the T11 vertebral body. Correlation with physical exam recommended. 2. No other acute traumatic injury within the thoracic spine. 3. Aortic atherosclerosis. 4. Cholelithiasis. Electronically Signed   By: Jeannine Boga M.D.   On: 02/04/2017 13:16   Dg Lumbar Spine 2-3 Views  Result Date: 02/04/2017 CLINICAL DATA:  Initial evaluation for acute trauma, fall. EXAM: LUMBAR SPINE - 2-3 VIEW COMPARISON:  None. FINDINGS: Trace anterolisthesis of L3 on L4 and L4 on L5. Vertebral bodies otherwise normally aligned with preservation of the normal lumbar lordosis. Mild wedging deformity of the superior endplate of F75, somewhat age indeterminate, but favored to be chronic. Otherwise vertebral body heights are maintained. No other acute fracture identified. Visualized sacrum intact.  Visualized bony pelvis grossly intact. Fixation hardware present at the left hip. Multilevel degenerative spondylolysis, greatest at L5-S1. No acute soft tissue abnormality. Cholelithiasis noted. Advanced aortic atherosclerosis. Osteopenia. IMPRESSION: 1. Mild compression deformity of the superior endplate of Z02, somewhat age indeterminate, but favored to be chronic. Correlation with physical exam for possible pain at this location recommended. 2. Cholelithiasis. 3. Aortic atherosclerosis. Electronically Signed   By: Jeannine Boga M.D.   On: 02/04/2017 13:11   Dg Pelvis 1-2 Views  Result Date: 02/04/2017 CLINICAL DATA:  Fall from bed today EXAM: PELVIS - 1-2 VIEW COMPARISON:  03/05/2015 pelvic radiograph FINDINGS: Intact screws in the left femoral head from prior ORIF, with no evidence of hardware loosening. No pelvic fracture or diastasis. No evidence of hip malalignment on this single frontal view. Degenerative changes in the visualized lower lumbar spine. No aggressive appearing focal osseous lesion. IMPRESSION: No acute pelvic fracture. Electronically Signed   By: Ilona Sorrel M.D.   On: 02/04/2017 13:07   Ct Head Wo Contrast  Result Date: 02/04/2017 CLINICAL DATA:  Altered mental status.  Code stroke EXAM: CT HEAD WITHOUT CONTRAST TECHNIQUE: Contiguous axial images were obtained from the base of the skull through the vertex without intravenous contrast. COMPARISON:  MRI head 06/21/2015 FINDINGS: Brain: Moderate to advanced atrophy. Mild chronic microvascular ischemic changes in the white matter. Chronic lacunar infarction in the left thalamus. Small chronic infarct right cerebellum. Chronic infarct right occipital lobe. Negative for acute infarct.  Negative for acute hemorrhage or mass. Vascular: Negative for hyperdense vessel. Atherosclerotic calcification Skull: Negative Sinuses/Orbits: Paranasal sinuses clear.  Right eye prosthesis. Other: None IMPRESSION: No acute intracranial abnormality.  Atrophy and chronic microvascular ischemia. Aspects score  10 These results were called by telephone at the time of interpretation on 02/04/2017 at 12:09 pm to Dr. Merrily Pew , who verbally acknowledged these results. Electronically Signed   By: Franchot Gallo M.D.   On: 02/04/2017 12:10    Procedures Procedures (including critical care time)  Medications Ordered in ED Medications  sodium chloride 0.9 % bolus 1,000 mL (1,000 mLs Intravenous New Bag/Given 02/04/17 1322)     Initial Impression / Assessment and Plan / ED Course  I have reviewed the triage vital signs and the nursing notes.  Pertinent labs & imaging results that were available during my care of the patient were reviewed by me and considered in my medical decision making (see chart for details).     Initially called out as a code stroke secondary to EMS report. However after evaluation of the patient and discussion with family and was very obvious that this was unlikely to be stroke. She was actually here for overall weakness and mental status change likely secondary to a urinary tract infection for which she's been on Cipro for. She fell today and scraped her back but was able to ambulate without difficulty afterwards. She had increased sleepiness today but no other neurologic changes. Her exam is relatively benign the above disconjugate gaze is secondary to prosthetic eye per the daughters. They feel like her face looks normal without any evidence of drooping. Plan for stroke workup and cancel The Code Stroke. Concentrating on-Causes for Encephalopathy.  Patient with persistent UTI. Family really wants to take her home but she had progressively worsening somnolence while in ER. Patient is DNR but usually is able to help with ADL's and that has decreased during her UTI and here in ER and I am not sure family is giong to be able to properly take care of her at home so will admit for observation.   Final Clinical Impressions(s) / ED  Diagnoses   Final diagnoses:  Lower urinary tract infectious disease    New Prescriptions New Prescriptions   No medications on file   I personally performed the services described in this documentation, which was scribed in my presence. The recorded information has been reviewed and is accurate.    Merrily Pew, MD 02/05/17 [phone]

## 2017-02-04 NOTE — Progress Notes (Signed)
CALL FROM RN [id number] PT ON TABLE 1151 EXAM FINISHED SENT TO SOC 1157 COMPLETED IN EPIC CALLED GR [id number]  DELAYS  -MD IN ROOM STILL ASSESSING PT -LAB IN ROOM  -NO ORDER IN EPIC UNTIL PATIENT WAS ON THE TABLE

## 2017-02-04 NOTE — ED Notes (Signed)
awaiting hospitalist evaluation-repaged

## 2017-02-04 NOTE — ED Notes (Signed)
Critical value:  Troponin 0.04 Dr Dayna Barker apprised

## 2017-02-04 NOTE — ED Notes (Signed)
Pt cathed for urine spec- she continues to move ad lib

## 2017-02-04 NOTE — Progress Notes (Signed)
Orthostatic vitals not done upon admission.  Patient lethargic and does not follow commands.

## 2017-02-04 NOTE — ED Notes (Signed)
Call to floor Re: report

## 2017-02-04 NOTE — ED Notes (Signed)
Daughters at bedside- pt has been on cipro since Friday Dx with UTI by Dr Nevada Crane Was confused after being found on floor and assisted to the toilet- pt walked to BR after cereral- tookmeds after the event

## 2017-02-04 NOTE — Progress Notes (Signed)
Bladder scan completed.  Scan showed 140cc of urine in bladder.

## 2017-02-04 NOTE — ED Notes (Signed)
Dr Dayna Barker at bedside speaking with daughters- daughter shows a picture of DNR from home and apprises Dr Dolly Rias of same  Per daughter, mother has an artificial eye and they brought her here basically to check her back

## 2017-02-04 NOTE — H&P (Signed)
History and Physical    [name] LOV:[id number] DOB: [birth year] DOA: 02/04/2017  PCP: Celene Squibb, MD   Patient coming from: Home  Chief Complaint: Fall and Altered mental status.  HPI: Elizabeth Pierce is a 83 y.o. female with medical history significant of atrial fibrillation, hypertension, CKD 3, CVA. Presented to the ED today with complaints of fall. Patient started to tablets at bedside providing most of the history,  As pt is confused.  Patient was in her normal state of health, through 2 days ago when she developed weakness, and changes in behavior. She was seen by her primary care provider 02/02/17- diagnosed with a UTI and started on ciprofloxacin, based on prior similar presentations . Unfortunately a urine sample could not be obtained from the patient on that visit. This am- pts daughter found pt on the floor in her bedroom, by her bed. She had slid from the bed, as her feet was tangled inbed sheets, and landed on her butt, did not hit her head, she was conscious the whole time. And was on the floor for about 45 minutes.  Patient's daughter Elizabeth Pierce patients has not been drinking adequately over the past few days. She has no fever and no vomiting no diarrhea no abdominal pain or dysuria. No chest pain or shortness of breath. At baseline patient discomfort indicate significant take care of herself with little assistance from her daughters, with mild cognitive deficits.  ED Course: in the ED, pts was slightly tachycardic, stable bp, 98% on RA. Lab work- Trop- 0.04, but negative on repeat Istat.  Initial concern in the ED that patient might have had a stroke. CT head negative. Lumbar and thoracic x-rays done showed mild compression deformity superior endplate of O84 favoured to be chronic. Hip x-ray no acute pelvic fracture. EKG showed A. Fib. UDS and alcohol level negative. Patient's was to be sent home from the ED on Keflex, cancelled, as patient was sleepy, and appeared confused- this started  in the ED.   Review of Systems: CONSTITUTIONAL- No Fever, weightloss, night sweat or change in appetite. SKIN- No Rash, colour changes or itching. HEAD- No Headache or dizziness. EYES- prosthetic right eye, reduced vision left. EARS- No vertigo, hearing loss or ear discharge. Mouth/throat- No Sorethroat, dentures, or bleeding gums. RESPIRATORY- No Cough or SOB. CARDIAC- No Palpitations, DOE, PND or chest pain. GI- No nausea, vomiting, diarrhoea, constipation, abd pain. URINARY- No Frequency, urgency, straining or dysuria. NEUROLOGIC- No Numbness, syncope, seizures or burning. [facility]- Denies depression or anxiety.  Past Medical History:  Diagnosis Date  . A-fib ([place])   . Acute renal failure ([place])   . Cataracts, bilateral   . CKD (chronic kidney disease), stage III    per PCP notes  . Fracture of femoral neck, left ([place])   . Hyperlipidemia   . Hypertension   . Stroke [facility])     Past Surgical History:  Procedure Laterality Date  . HIP PINNING,CANNULATED Left 03/06/2015   Procedure: CANNULATED HIP PINNING;  Surgeon: Carole Civil, MD;  Location: [facility];  Service: Orthopedics;  Laterality: Left;     reports that she has never smoked. Her smokeless tobacco use includes Snuff. She reports that she does not drink alcohol or use drugs.  Allergies  Allergen Reactions  . Tramadol Other (See Comments)    Reaction:  Confusion     Prior to Admission medications   Medication Sig Start Date End Date Taking? Authorizing Provider  acetaminophen (TYLENOL) 325 MG tablet  Take 325 mg by mouth every 6 (six) hours as needed for mild pain, moderate pain, fever or headache.    Yes [provider]  apixaban (ELIQUIS) 2.5 MG TABS tablet Take 1 tablet (2.5 mg total) by mouth 2 (two) times daily. 06/22/15  Yes Black, Lezlie Octave, NP  COMBIGAN 0.2-0.5 % ophthalmic solution Place 1 drop into the left eye every 12 (twelve) hours.    Yes [provider]  docusate sodium (COLACE) 100 MG  capsule Take 100 mg by mouth 2 (two) times daily.    Yes [provider]  Ferrous Sulfate (SLOW FE) 142 (45 Fe) MG TBCR Take 1 tablet by mouth 2 (two) times daily.   Yes [provider]  Multiple Vitamins-Minerals (MULTIVITAMIN WITH MINERALS) tablet Take 1 tablet by mouth daily.   Yes [provider]  Multiple Vitamins-Minerals (PRESERVISION AREDS PO) Take 1 capsule by mouth daily.    Yes [provider]  simethicone (MYLICON) 80 MG chewable tablet Chew 80 mg by mouth daily.    Yes [provider]  TRAVATAN Z 0.004 % SOLN ophthalmic solution Place 1 drop into the left eye at bedtime.    Yes [provider]  cephALEXin (KEFLEX) 500 MG capsule Take 1 capsule (500 mg total) by mouth 4 (four) times daily. 02/04/17 02/09/17  Mesner, Corene Cornea, MD    Physical Exam: Vitals:   02/04/17 1900 02/04/17 1930 02/04/17 1937 02/04/17 2014  BP: (!) 145/70 137/63  139/78  Pulse: 92 83  75  Resp: (!) 22 (!) 22  20  Temp:   98.6 F (37 C) 99.8 F (37.7 C)  TempSrc:   Oral Oral  SpO2: 97% 96%  98%  Weight:    45.3 kg (99 lb 12.8 oz)  Height:    5\' 1"  (1.549 m)    Constitutional: NAD, calm, comfortable Vitals:   02/04/17 1900 02/04/17 1930 02/04/17 1937 02/04/17 2014  BP: (!) 145/70 137/63  139/78  Pulse: 92 83  75  Resp: (!) 22 (!) 22  20  Temp:   98.6 F (37 C) 99.8 F (37.7 C)  TempSrc:   Oral Oral  SpO2: 97% 96%  98%  Weight:    45.3 kg (99 lb 12.8 oz)  Height:    5\' 1"  (1.549 m)   Eyes: Prosthetic right eye, sluggish reaction to light left pupil, 79mm, normal lids and conjunctivae normal ENMT: Mucous membranes are mildly dry,. Posterior pharynx clear of any exudate or lesions. Neck: normal, supple, no masses, no thyromegaly Respiratory: clear to auscultation bilaterally, no wheezing, no crackles. Normal respiratory effort. No accessory muscle use.  Cardiovascular: irregular rate and rhythm, no murmurs / rubs / gallops. No extremity edema. 2+  pedal pulses. No carotid bruits.  Abdomen: no tenderness, no masses palpated. No hepatosplenomegaly. Bowel sounds positive. Lower abdomen appear firm. Pt winced on checking for CVA tenderness but denied pain.  Musculoskeletal: no clubbing / cyanosis. No joint deformity upper and lower extremities. Good ROM, no contractures. Normal muscle tone.  Skin: no rashes, lesions, ulcers. No induration Neurologic: Co-operation with exam limited, CN 2-12 grossly intact. Sensation intact, Strength 4+/5 in all 4.  Psychiatric: Normal judgment and insight. Sleepy but arousable, pleasantly confused, few appropriate response to questions, daughter say this is far from patients baseline.   Labs on Admission: I have personally reviewed following labs and imaging studies  CBC:  Recent Labs Lab 02/04/17 1144 02/04/17 1151  WBC 8.8  --   NEUTROABS 7.1  --  HGB 12.8 13.6  HCT 39.3 40.0  MCV 88.9  --   PLT 204  --    Basic Metabolic Panel:  Recent Labs Lab 02/04/17 1144 02/04/17 1151  NA 139 142  K 3.9 3.9  CL 105 103  CO2 24  --   GLUCOSE 184* 179*  BUN 30* 31*  CREATININE 1.50* 1.50*  CALCIUM 9.6  --    Liver Function Tests:  Recent Labs Lab 02/04/17 1144  AST 31  ALT 18  ALKPHOS 51  BILITOT 0.6  PROT 7.5  ALBUMIN 4.1   Coagulation Profile:  Recent Labs Lab 02/04/17 1144  INR 1.24   Cardiac Enzymes:  Recent Labs Lab 02/04/17 1144  TROPONINI 0.04*   Urine analysis:    Component Value Date/Time   COLORURINE YELLOW 02/04/2017 1155   APPEARANCEUR HAZY (A) 02/04/2017 1155   LABSPEC 1.020 02/04/2017 1155   PHURINE 5.0 02/04/2017 1155   GLUCOSEU NEGATIVE 02/04/2017 1155   HGBUR NEGATIVE 02/04/2017 [place] 02/04/2017 1155   KETONESUR NEGATIVE 02/04/2017 1155   PROTEINUR 100 (A) 02/04/2017 1155   UROBILINOGEN 0.2 06/21/2015 1242   NITRITE POSITIVE (A) 02/04/2017 1155   LEUKOCYTESUR SMALL (A) 02/04/2017 1155   Radiological Exams on Admission: Dg  Thoracic Spine 2 View  Result Date: 02/04/2017 CLINICAL DATA:  Initial evaluation for acute trauma, fall. EXAM: THORACIC SPINE 2 VIEWS COMPARISON:  None. FINDINGS: Mild scoliosis of the thoracolumbar spine. Vertebral bodies otherwise normally aligned with preservation of the normal thoracic kyphosis. Height loss at the T11 vertebral body, better seen on corresponding lumbar spine radiograph. Vertebral body heights otherwise maintained. No other acute fracture. No acute soft tissue abnormality. Visualized lungs are clear. Aortic caps ptosis. Cholelithiasis. IMPRESSION: 1. Age indeterminate height loss at the T11 vertebral body. Correlation with physical exam recommended. 2. No other acute traumatic injury within the thoracic spine. 3. Aortic atherosclerosis. 4. Cholelithiasis. Electronically Signed   By: Jeannine Boga M.D.   On: 02/04/2017 13:16   Dg Lumbar Spine 2-3 Views  Result Date: 02/04/2017 CLINICAL DATA:  Initial evaluation for acute trauma, fall. EXAM: LUMBAR SPINE - 2-3 VIEW COMPARISON:  None. FINDINGS: Trace anterolisthesis of L3 on L4 and L4 on L5. Vertebral bodies otherwise normally aligned with preservation of the normal lumbar lordosis. Mild wedging deformity of the superior endplate of Z61, somewhat age indeterminate, but favored to be chronic. Otherwise vertebral body heights are maintained. No other acute fracture identified. Visualized sacrum intact. Visualized bony pelvis grossly intact. Fixation hardware present at the left hip. Multilevel degenerative spondylolysis, greatest at L5-S1. No acute soft tissue abnormality. Cholelithiasis noted. Advanced aortic atherosclerosis. Osteopenia. IMPRESSION: 1. Mild compression deformity of the superior endplate of W96, somewhat age indeterminate, but favored to be chronic. Correlation with physical exam for possible pain at this location recommended. 2. Cholelithiasis. 3. Aortic atherosclerosis. Electronically Signed   By: Jeannine Boga  M.D.   On: 02/04/2017 13:11   Dg Pelvis 1-2 Views  Result Date: 02/04/2017 CLINICAL DATA:  Fall from bed today EXAM: PELVIS - 1-2 VIEW COMPARISON:  03/05/2015 pelvic radiograph FINDINGS: Intact screws in the left femoral head from prior ORIF, with no evidence of hardware loosening. No pelvic fracture or diastasis. No evidence of hip malalignment on this single frontal view. Degenerative changes in the visualized lower lumbar spine. No aggressive appearing focal osseous lesion. IMPRESSION: No acute pelvic fracture. Electronically Signed   By: Ilona Sorrel M.D.   On: 02/04/2017 13:07   Ct Head  Wo Contrast  Result Date: 02/04/2017 CLINICAL DATA:  Altered mental status.  Code stroke EXAM: CT HEAD WITHOUT CONTRAST TECHNIQUE: Contiguous axial images were obtained from the base of the skull through the vertex without intravenous contrast. COMPARISON:  MRI head 06/21/2015 FINDINGS: Brain: Moderate to advanced atrophy. Mild chronic microvascular ischemic changes in the white matter. Chronic lacunar infarction in the left thalamus. Small chronic infarct right cerebellum. Chronic infarct right occipital lobe. Negative for acute infarct.  Negative for acute hemorrhage or mass. Vascular: Negative for hyperdense vessel. Atherosclerotic calcification Skull: Negative Sinuses/Orbits: Paranasal sinuses clear.  Right eye prosthesis. Other: None IMPRESSION: No acute intracranial abnormality. Atrophy and chronic microvascular ischemia. Aspects score  10 These results were called by telephone at the time of interpretation on 02/04/2017 at 12:09 pm to Dr. Merrily Pew , who verbally acknowledged these results. Electronically Signed   By: Franchot Gallo M.D.   On: 02/04/2017 12:10   EKG: Rate- 110, afib.   Assessment/Plan Principal Problem:   Altered mental status Active Problems:   A-fib ([facility])   CKD (chronic kidney disease), stage III   Hypertension  Altered mental Status- likely from UTI, possibly mild dehydration. Pt  was placed on ciprofloxacin. Last urine culture- 02/2015- Ecoli resistant to Cipro. WBC- 8.8. 2L N/s given in ED. No fever. - Admit to Med surg, Obs - Cont ceftriazone given in ED, consider treatment for up to 7 days for possible Pyelonephritis. - Obtain urine culture  - Mild dehydration, 2L already given in ED, gentle hydration N/s+20 KCl at 50cc/hr of 12 hrs.  - Bladder scan for lower abd fullness- 140cc of urine.  - No focal deficit to suggest CVA.   FAll- Xray- hip, thoracic and lumber spine- T11 compression fracture, likely from osteoporosis. - PT eval.  - CK- 123.  [place] Fib- Rates stable. Not on rate limiting meds. - Cont Home eliquis  Prosthetic right eye.  Tachypnea- without fever or cough.  - Port Xray chest.   Elevated troponin- 0.04, neg on I stat. EKG- atria fib. No chest pain or SOB.  - Trop X1.   CKD- 3-4- Cr stable at baseline- 1.5.   DVT prophylaxis: Eliquis Code Status: DNR- pts wishes, confirmed with both daughter at bedside.  Family Communication: Daughters present at bedside.  Disposition Plan: Home Consults called: None  Admission status: med surg obs.   [name] MD [facility] Pager [phone]  If 11PM-7AM, please contact night-coverage [email or web address] Password [email or web address]  02/04/2017, 10:16 PM

## 2017-02-04 NOTE — ED Triage Notes (Signed)
Last well at 1100- getting out of bed, slid to floor and has abrasion to her mid back Periods of confusion and inability to respond to queries- "zoned out" per EMS

## 2017-02-04 NOTE — ED Notes (Signed)
To radiology

## 2017-02-04 NOTE — ED Notes (Signed)
Dr Dayna Barker in to assess

## 2017-02-04 NOTE — ED Notes (Signed)
Report to Bree, RN 

## 2017-02-05 ENCOUNTER — Encounter (HOSPITAL_COMMUNITY): Payer: Self-pay | Admitting: *Deleted

## 2017-02-05 DIAGNOSIS — N390 Urinary tract infection, site not specified: Principal | ICD-10-CM

## 2017-02-05 DIAGNOSIS — N183 Chronic kidney disease, stage 3 (moderate): Secondary | ICD-10-CM | POA: Diagnosis not present

## 2017-02-05 DIAGNOSIS — G9340 Encephalopathy, unspecified: Secondary | ICD-10-CM | POA: Diagnosis not present

## 2017-02-05 DIAGNOSIS — I4891 Unspecified atrial fibrillation: Secondary | ICD-10-CM

## 2017-02-05 DIAGNOSIS — L8990 Pressure ulcer of unspecified site, unspecified stage: Secondary | ICD-10-CM | POA: Insufficient documentation

## 2017-02-05 LAB — TROPONIN I: TROPONIN I: 0.03 ng/mL — AB (ref <0.03)

## 2017-02-05 LAB — MRSA PCR SCREENING: MRSA by PCR: NEGATIVE

## 2017-02-05 MED ORDER — TIMOLOL MALEATE 0.5 % OP SOLN
1.0000 [drp] | Freq: Two times a day (BID) | OPHTHALMIC | Status: DC
  Filled 2017-02-05: qty 5

## 2017-02-05 MED ORDER — BRIMONIDINE TARTRATE 0.2 % OP SOLN
1.0000 [drp] | Freq: Two times a day (BID) | OPHTHALMIC | Status: DC
  Filled 2017-02-05: qty 5

## 2017-02-05 NOTE — Progress Notes (Signed)
Patient discharged home with daughter.  IV removed  - WNL.  Reviewed DC instructions and medications with daughter.  Educated on proper peri care and frequent toileting to prevent further UTI.  Emphasized importance of completing dose of Abx.  No questions at this time. Verbalized understanding.  Patient in NAD.  Assisted off unit via WC.

## 2017-02-05 NOTE — Discharge Summary (Signed)
Physician Discharge Summary  Elizabeth Pierce:[id number] DOB: Jul 15, 1919 DOA: 02/04/2017  PCP: Celene Squibb, MD  Admit date: 02/04/2017 Discharge date: 02/05/2017  Recommendations for Outpatient Follow-up:  1. Follow-up resolution of UTI 2. CT chest x-ray report below, prominent aortic arch of unclear significance, could consider outpatient CT if clinically indicated. Defer to PCP.   Follow-up Information    Celene Squibb, MD Follow up in 2 day(s).   Specialty:  Internal Medicine Why:  emergency room follow up Contact information: [place] [address] [phone]        [facility] DEPART[facility] Follow up.   Specialty:  Emergency Medicine Why:  if not improving or symptoms worsen Contact information: [address] [id number] [place] [address] [phone]          Discharge Diagnoses:  1. UTI with associated acute encephalopathy 2. Fall at home 3. Elevated troponin 4. Chronic kidney disease stage III 5. Atrial fibrillation 6. Aortic atherosclerosis  Discharge Condition: Improved Disposition: Return home  Diet recommendation: Regular  Filed Weights   02/04/17 1139 02/04/17 2014  Weight: 50.8 kg (112 lb) 45.3 kg (99 lb 12.8 oz)    History of present illness:  83 year old woman PMH atrial fibrillation, chronic kidney disease, presented after a fall at home. Recently diagnosed with UTI and started on ciprofloxacin. Daughter found patient on floor bedroom, no closed head injury, no loss of consciousness. At baseline the patient is able to take care of herself with little assistance. Mild cognitive deficits. Apparently there was some initial concern for the possibility of stroke, however when further history is available, symptoms were thought more attributable to UTI. Admitted for acute encephalopathy, secondary to UTI, dehydration.  Hospital Course:  Patient was treated with empiric antibiotic, IV fluids overnight  with a rapid improvement and resolution of acute encephalopathy. Family reports the patient [age] is significantly improved and desired discharge home. The patient is closely and well cared for at home. Hospitalization was uncomplicated. The visual issues as below.  #1: UTI with associated acute encephalopathy and fall at home. No pain with palpation of bilateral flanks. No evidence of pyelonephritis. -Appears resolved at this point. Afebrile. -Complete course of Keflex as an outpatient as already prescribed.  #2: Fall at home. No apparent injury. X-rays reviewed. Favor T1 compression injury as old given lack of pain.  #3: Elevated troponin. Trivial. Flat. No clinical significance. No further evaluation suggested. EKG was nonacute.  #4: Chronic kidney disease stage III. Stable.  #5: Atrial fibrillation on apixaban.  #6: Aortic atherosclerosis. Asymptomatic. No further evaluation suggested.  Antimicrobials:  Ceftriaxone 5/13   Keflex 5/14 >>  Today's assessment: S: Feels well, no pain, no complaints. 2 daughters and caretaker bedside. All report the patient is 81% better. Ambulated to bathroom twice last night without difficulty. Hoping to go home today. O:  Vitals: Afebrile, temperature 99.8, respirations 20, pulse 83, blood pressure 98/63, SPO2 97% on room air  Constitutional: Appears calm, comfortable.  Cardiovascular: Irregular. Normal rate. No rub or gallop. No lower extremity edema.  Respiratory: Clear to auscultation bilaterally. No wheezes, rales or rhonchi. Normal respiratory effort.  Psychiatric: Awake, alert, follows simple commands  Discharge Instructions  Discharge Instructions    Diet general    Complete by:  As directed    Discharge instructions    Complete by:  As directed    Call your physician or seek immediate medical attention for fever, confusion, falls, pain or worsening of condition.  Increase activity slowly    Complete by:  As directed      Allergies  as of 02/05/2017      Reactions   Tramadol Other (See Comments)   Reaction:  Confusion       Medication List    TAKE these medications   acetaminophen 325 MG tablet Commonly known as:  TYLENOL Take 325 mg by mouth every 6 (six) hours as needed for mild pain, moderate pain, fever or headache.   apixaban 2.5 MG Tabs tablet Commonly known as:  ELIQUIS Take 1 tablet (2.5 mg total) by mouth 2 (two) times daily.   cephALEXin 500 MG capsule Commonly known as:  KEFLEX Take 1 capsule (500 mg total) by mouth 4 (four) times daily.   COMBIGAN 0.2-0.5 % ophthalmic solution Generic drug:  brimonidine-timolol Place 1 drop into the left eye every 12 (twelve) hours.   docusate sodium 100 MG capsule Commonly known as:  COLACE Take 100 mg by mouth 2 (two) times daily.   PRESERVISION AREDS PO Take 1 capsule by mouth daily.   multivitamin with minerals tablet Take 1 tablet by mouth daily.   simethicone 80 MG chewable tablet Commonly known as:  MYLICON Chew 80 mg by mouth daily.   SLOW FE 142 (45 Fe) MG Tbcr Generic drug:  Ferrous Sulfate Take 1 tablet by mouth 2 (two) times daily.   TRAVATAN Z 0.004 % Soln ophthalmic solution Generic drug:  Travoprost (BAK Free) Place 1 drop into the left eye at bedtime.      Allergies  Allergen Reactions  . Tramadol Other (See Comments)    Reaction:  Confusion     The results of significant diagnostics from this hospitalization (including imaging, microbiology, ancillary and laboratory) are listed below for reference.    Significant Diagnostic Studies: Dg Thoracic Spine 2 View  Result Date: 02/04/2017 CLINICAL DATA:  Initial evaluation for acute trauma, fall. EXAM: THORACIC SPINE 2 VIEWS COMPARISON:  None. FINDINGS: Mild scoliosis of the thoracolumbar spine. Vertebral bodies otherwise normally aligned with preservation of the normal thoracic kyphosis. Height loss at the T11 vertebral body, better seen on corresponding lumbar spine radiograph.  Vertebral body heights otherwise maintained. No other acute fracture. No acute soft tissue abnormality. Visualized lungs are clear. Aortic caps ptosis. Cholelithiasis. IMPRESSION: 1. Age indeterminate height loss at the T11 vertebral body. Correlation with physical exam recommended. 2. No other acute traumatic injury within the thoracic spine. 3. Aortic atherosclerosis. 4. Cholelithiasis. Electronically Signed   By: Jeannine Boga M.D.   On: 02/04/2017 13:16   Dg Lumbar Spine 2-3 Views  Result Date: 02/04/2017 CLINICAL DATA:  Initial evaluation for acute trauma, fall. EXAM: LUMBAR SPINE - 2-3 VIEW COMPARISON:  None. FINDINGS: Trace anterolisthesis of L3 on L4 and L4 on L5. Vertebral bodies otherwise normally aligned with preservation of the normal lumbar lordosis. Mild wedging deformity of the superior endplate of V94, somewhat age indeterminate, but favored to be chronic. Otherwise vertebral body heights are maintained. No other acute fracture identified. Visualized sacrum intact. Visualized bony pelvis grossly intact. Fixation hardware present at the left hip. Multilevel degenerative spondylolysis, greatest at L5-S1. No acute soft tissue abnormality. Cholelithiasis noted. Advanced aortic atherosclerosis. Osteopenia. IMPRESSION: 1. Mild compression deformity of the superior endplate of I01, somewhat age indeterminate, but favored to be chronic. Correlation with physical exam for possible pain at this location recommended. 2. Cholelithiasis. 3. Aortic atherosclerosis. Electronically Signed   By: Jeannine Boga M.D.   On: 02/04/2017 13:11  Dg Pelvis 1-2 Views  Result Date: 02/04/2017 CLINICAL DATA:  Fall from bed today EXAM: PELVIS - 1-2 VIEW COMPARISON:  03/05/2015 pelvic radiograph FINDINGS: Intact screws in the left femoral head from prior ORIF, with no evidence of hardware loosening. No pelvic fracture or diastasis. No evidence of hip malalignment on this single frontal view. Degenerative  changes in the visualized lower lumbar spine. No aggressive appearing focal osseous lesion. IMPRESSION: No acute pelvic fracture. Electronically Signed   By: Ilona Sorrel M.D.   On: 02/04/2017 13:07   Ct Head Wo Contrast  Result Date: 02/04/2017 CLINICAL DATA:  Altered mental status.  Code stroke EXAM: CT HEAD WITHOUT CONTRAST TECHNIQUE: Contiguous axial images were obtained from the base of the skull through the vertex without intravenous contrast. COMPARISON:  MRI head 06/21/2015 FINDINGS: Brain: Moderate to advanced atrophy. Mild chronic microvascular ischemic changes in the white matter. Chronic lacunar infarction in the left thalamus. Small chronic infarct right cerebellum. Chronic infarct right occipital lobe. Negative for acute infarct.  Negative for acute hemorrhage or mass. Vascular: Negative for hyperdense vessel. Atherosclerotic calcification Skull: Negative Sinuses/Orbits: Paranasal sinuses clear.  Right eye prosthesis. Other: None IMPRESSION: No acute intracranial abnormality. Atrophy and chronic microvascular ischemia. Aspects score  10 These results were called by telephone at the time of interpretation on 02/04/2017 at 12:09 pm to Dr. Merrily Pew , who verbally acknowledged these results. Electronically Signed   By: Franchot Gallo M.D.   On: 02/04/2017 12:10   Dg Chest Port 1 View  Result Date: 02/05/2017 CLINICAL DATA:  Tachypnea and altered mental status EXAM: PORTABLE CHEST 1 VIEW COMPARISON:  03/05/2015 CXR. FINDINGS: Cardiomegaly with aortic atherosclerosis. Patient is tilted and slightly rotated on current exam. Despite this, the aortic arch appears slightly more prominent in mild aneurysmal dilatation is not excluded. No pneumonic consolidation. Trace left effusion. No overt pulmonary edema. No acute nor suspicious osseous abnormalities. IMPRESSION: Cardiomegaly with aortic atherosclerosis. The aortic arch appears slightly more prominent and aneurysmal dilatation is not entirely  excluded. This could be in part due to rotation and tilt of the patient. CT may help for further correlation as deemed clinically necessary. Electronically Signed   By: Ashley Royalty M.D.   On: 02/05/2017 00:25    Microbiology: Recent Results (from the past 240 hour(s))  MRSA PCR Screening     Status: None   Collection Time: 02/05/17  4:55 AM  Result Value Ref Range Status   MRSA by PCR NEGATIVE NEGATIVE Final    Comment:        The GeneXpert MRSA Assay (FDA approved for NASAL specimens only), is one component of a comprehensive MRSA colonization surveillance program. It is not intended to diagnose MRSA infection nor to guide or monitor treatment for MRSA infections.      Labs: Basic Metabolic Panel:  Recent Labs Lab 02/04/17 1144 02/04/17 1151  NA 139 142  K 3.9 3.9  CL 105 103  CO2 24  --   GLUCOSE 184* 179*  BUN 30* 31*  CREATININE 1.50* 1.50*  CALCIUM 9.6  --    Liver Function Tests:  Recent Labs Lab 02/04/17 1144  AST 31  ALT 18  ALKPHOS 51  BILITOT 0.6  PROT 7.5  ALBUMIN 4.1   CBC:  Recent Labs Lab 02/04/17 1144 02/04/17 1151  WBC 8.8  --   NEUTROABS 7.1  --   HGB 12.8 13.6  HCT 39.3 40.0  MCV 88.9  --   PLT 204  --  Cardiac Enzymes:  Recent Labs Lab 02/04/17 1144 02/04/17 2041 02/04/17 2318  CKTOTAL  --  123  --   TROPONINI 0.04*  --  0.03*    Principal Problem:   Altered mental status Active Problems:   A-fib (HCC)   CKD (chronic kidney disease), stage III   Hypertension   Pressure injury of skin   Time coordinating discharge: 35 minutes  Signed:  Murray Hodgkins, MD [facility] 02/05/2017, 9:48 AM

## 2017-02-05 NOTE — Care Management Obs Status (Signed)
[facility] NOTIFICATION   Patient Details  Name: Elizabeth Pierce MRN: [id number] Date of Birth: February 24, 1919   Medicare Observation Status Notification Given:  No (discharged within 24 hours)    Celie Desrochers, Chauncey Reading, RN 02/05/2017, 10:16 AM

## 2017-02-07 LAB — URINE CULTURE

## 2017-03-06 DIAGNOSIS — F5119 Other hypersomnia not due to a substance or known physiological condition: Secondary | ICD-10-CM | POA: Diagnosis not present

## 2017-03-06 DIAGNOSIS — R4182 Altered mental status, unspecified: Secondary | ICD-10-CM | POA: Diagnosis not present

## 2017-03-06 DIAGNOSIS — R442 Other hallucinations: Secondary | ICD-10-CM | POA: Diagnosis not present

## 2017-03-06 DIAGNOSIS — R638 Other symptoms and signs concerning food and fluid intake: Secondary | ICD-10-CM | POA: Diagnosis not present

## 2017-03-14 DIAGNOSIS — R630 Anorexia: Secondary | ICD-10-CM | POA: Diagnosis not present

## 2017-03-14 DIAGNOSIS — H409 Unspecified glaucoma: Secondary | ICD-10-CM | POA: Diagnosis not present

## 2017-03-14 DIAGNOSIS — I6789 Other cerebrovascular disease: Secondary | ICD-10-CM | POA: Diagnosis not present

## 2017-03-14 DIAGNOSIS — R0602 Shortness of breath: Secondary | ICD-10-CM | POA: Diagnosis not present

## 2017-03-14 DIAGNOSIS — I4891 Unspecified atrial fibrillation: Secondary | ICD-10-CM | POA: Diagnosis not present

## 2017-03-14 DIAGNOSIS — I10 Essential (primary) hypertension: Secondary | ICD-10-CM | POA: Diagnosis not present

## 2017-03-14 DIAGNOSIS — R531 Weakness: Secondary | ICD-10-CM | POA: Diagnosis not present

## 2017-03-14 DIAGNOSIS — R5383 Other fatigue: Secondary | ICD-10-CM | POA: Diagnosis not present

## 2017-03-14 DIAGNOSIS — E785 Hyperlipidemia, unspecified: Secondary | ICD-10-CM | POA: Diagnosis not present

## 2017-03-14 DIAGNOSIS — I69891 Dysphagia following other cerebrovascular disease: Secondary | ICD-10-CM | POA: Diagnosis not present

## 2017-03-15 DIAGNOSIS — I10 Essential (primary) hypertension: Secondary | ICD-10-CM | POA: Diagnosis not present

## 2017-03-15 DIAGNOSIS — R531 Weakness: Secondary | ICD-10-CM | POA: Diagnosis not present

## 2017-03-15 DIAGNOSIS — H409 Unspecified glaucoma: Secondary | ICD-10-CM | POA: Diagnosis not present

## 2017-03-15 DIAGNOSIS — I4891 Unspecified atrial fibrillation: Secondary | ICD-10-CM | POA: Diagnosis not present

## 2017-03-15 DIAGNOSIS — I6789 Other cerebrovascular disease: Secondary | ICD-10-CM | POA: Diagnosis not present

## 2017-03-15 DIAGNOSIS — E785 Hyperlipidemia, unspecified: Secondary | ICD-10-CM | POA: Diagnosis not present

## 2017-03-16 DIAGNOSIS — E785 Hyperlipidemia, unspecified: Secondary | ICD-10-CM | POA: Diagnosis not present

## 2017-03-16 DIAGNOSIS — I4891 Unspecified atrial fibrillation: Secondary | ICD-10-CM | POA: Diagnosis not present

## 2017-03-16 DIAGNOSIS — R531 Weakness: Secondary | ICD-10-CM | POA: Diagnosis not present

## 2017-03-16 DIAGNOSIS — I6789 Other cerebrovascular disease: Secondary | ICD-10-CM | POA: Diagnosis not present

## 2017-03-16 DIAGNOSIS — I10 Essential (primary) hypertension: Secondary | ICD-10-CM | POA: Diagnosis not present

## 2017-03-16 DIAGNOSIS — H409 Unspecified glaucoma: Secondary | ICD-10-CM | POA: Diagnosis not present

## 2017-03-17 DIAGNOSIS — I10 Essential (primary) hypertension: Secondary | ICD-10-CM | POA: Diagnosis not present

## 2017-03-17 DIAGNOSIS — I4891 Unspecified atrial fibrillation: Secondary | ICD-10-CM | POA: Diagnosis not present

## 2017-03-17 DIAGNOSIS — E785 Hyperlipidemia, unspecified: Secondary | ICD-10-CM | POA: Diagnosis not present

## 2017-03-17 DIAGNOSIS — I6789 Other cerebrovascular disease: Secondary | ICD-10-CM | POA: Diagnosis not present

## 2017-03-17 DIAGNOSIS — R531 Weakness: Secondary | ICD-10-CM | POA: Diagnosis not present

## 2017-03-17 DIAGNOSIS — H409 Unspecified glaucoma: Secondary | ICD-10-CM | POA: Diagnosis not present

## 2017-03-18 DIAGNOSIS — R531 Weakness: Secondary | ICD-10-CM | POA: Diagnosis not present

## 2017-03-18 DIAGNOSIS — I6789 Other cerebrovascular disease: Secondary | ICD-10-CM | POA: Diagnosis not present

## 2017-03-18 DIAGNOSIS — I10 Essential (primary) hypertension: Secondary | ICD-10-CM | POA: Diagnosis not present

## 2017-03-18 DIAGNOSIS — H409 Unspecified glaucoma: Secondary | ICD-10-CM | POA: Diagnosis not present

## 2017-03-18 DIAGNOSIS — E785 Hyperlipidemia, unspecified: Secondary | ICD-10-CM | POA: Diagnosis not present

## 2017-03-18 DIAGNOSIS — I4891 Unspecified atrial fibrillation: Secondary | ICD-10-CM | POA: Diagnosis not present

## 2017-03-19 DIAGNOSIS — H409 Unspecified glaucoma: Secondary | ICD-10-CM | POA: Diagnosis not present

## 2017-03-19 DIAGNOSIS — I6789 Other cerebrovascular disease: Secondary | ICD-10-CM | POA: Diagnosis not present

## 2017-03-19 DIAGNOSIS — I10 Essential (primary) hypertension: Secondary | ICD-10-CM | POA: Diagnosis not present

## 2017-03-19 DIAGNOSIS — E785 Hyperlipidemia, unspecified: Secondary | ICD-10-CM | POA: Diagnosis not present

## 2017-03-19 DIAGNOSIS — I4891 Unspecified atrial fibrillation: Secondary | ICD-10-CM | POA: Diagnosis not present

## 2017-03-19 DIAGNOSIS — R531 Weakness: Secondary | ICD-10-CM | POA: Diagnosis not present

## 2017-03-25 DEATH — deceased
